# Patient Record
Sex: Female | Born: 1985 | Race: Black or African American | Hispanic: No | Marital: Single | Smoking: Never smoker
Health system: Southern US, Community
[De-identification: ages and names within clinical notes are randomized; demographics above are authoritative.]

## PROBLEM LIST (undated history)

## (undated) DIAGNOSIS — R002 Palpitations: Secondary | ICD-10-CM

## (undated) DIAGNOSIS — K219 Gastro-esophageal reflux disease without esophagitis: Secondary | ICD-10-CM

## (undated) DIAGNOSIS — I10 Essential (primary) hypertension: Secondary | ICD-10-CM

## (undated) DIAGNOSIS — F419 Anxiety disorder, unspecified: Secondary | ICD-10-CM

## (undated) DIAGNOSIS — D649 Anemia, unspecified: Secondary | ICD-10-CM

## (undated) DIAGNOSIS — G4730 Sleep apnea, unspecified: Secondary | ICD-10-CM

## (undated) DIAGNOSIS — Z87440 Personal history of urinary (tract) infections: Secondary | ICD-10-CM

## (undated) HISTORY — PX: UPPER GASTROINTESTINAL ENDOSCOPY: SHX188

## (undated) HISTORY — DX: Essential (primary) hypertension: I10

## (undated) HISTORY — DX: Palpitations: R00.2

## (undated) HISTORY — DX: Gastro-esophageal reflux disease without esophagitis: K21.9

## (undated) HISTORY — DX: Personal history of urinary (tract) infections: Z87.440

## (undated) HISTORY — DX: Anemia, unspecified: D64.9

---

## 2004-07-03 ENCOUNTER — Emergency Department (HOSPITAL_COMMUNITY): Admission: EM | Admit: 2004-07-03 | Discharge: 2004-07-03 | Payer: Self-pay | Admitting: Emergency Medicine

## 2009-11-10 LAB — US OB TRANSVAGINAL

## 2010-03-09 ENCOUNTER — Encounter: Admission: RE | Admit: 2010-03-09 | Discharge: 2010-04-28 | Payer: Self-pay | Admitting: Obstetrics and Gynecology

## 2010-05-14 ENCOUNTER — Inpatient Hospital Stay (HOSPITAL_COMMUNITY): Admission: AD | Admit: 2010-05-14 | Discharge: 2010-05-14 | Payer: Self-pay | Admitting: Obstetrics and Gynecology

## 2010-05-14 DIAGNOSIS — N760 Acute vaginitis: Secondary | ICD-10-CM

## 2010-05-14 DIAGNOSIS — O2390 Unspecified genitourinary tract infection in pregnancy, unspecified trimester: Secondary | ICD-10-CM

## 2010-05-14 DIAGNOSIS — O4700 False labor before 37 completed weeks of gestation, unspecified trimester: Secondary | ICD-10-CM

## 2010-05-14 DIAGNOSIS — A499 Bacterial infection, unspecified: Secondary | ICD-10-CM

## 2010-05-14 DIAGNOSIS — B9689 Other specified bacterial agents as the cause of diseases classified elsewhere: Secondary | ICD-10-CM

## 2010-07-02 ENCOUNTER — Inpatient Hospital Stay (HOSPITAL_COMMUNITY)
Admission: AD | Admit: 2010-07-02 | Discharge: 2010-07-06 | Payer: Self-pay | Source: Home / Self Care | Attending: Obstetrics and Gynecology | Admitting: Obstetrics and Gynecology

## 2010-07-03 ENCOUNTER — Encounter (INDEPENDENT_AMBULATORY_CARE_PROVIDER_SITE_OTHER): Payer: Self-pay | Admitting: Obstetrics and Gynecology

## 2010-07-06 ENCOUNTER — Emergency Department (HOSPITAL_COMMUNITY)
Admission: EM | Admit: 2010-07-06 | Discharge: 2010-07-06 | Payer: Self-pay | Source: Home / Self Care | Admitting: Psychiatry

## 2010-10-11 LAB — URINALYSIS, ROUTINE W REFLEX MICROSCOPIC
Bilirubin Urine: NEGATIVE
Ketones, ur: NEGATIVE mg/dL
Nitrite: NEGATIVE
Protein, ur: NEGATIVE mg/dL
Specific Gravity, Urine: 1.006 (ref 1.005–1.030)
Urobilinogen, UA: 0.2 mg/dL (ref 0.0–1.0)

## 2010-10-11 LAB — CBC
HCT: 34.8 % — ABNORMAL LOW (ref 36.0–46.0)
HCT: 26.1 % — ABNORMAL LOW (ref 36.0–46.0)
Hemoglobin: 11.4 g/dL — ABNORMAL LOW (ref 12.0–15.0)
Hemoglobin: 8.4 g/dL — ABNORMAL LOW (ref 12.0–15.0)
Hemoglobin: 8.6 g/dL — ABNORMAL LOW (ref 12.0–15.0)
MCH: 26.2 pg (ref 26.0–34.0)
MCH: 25 pg — ABNORMAL LOW (ref 26.0–34.0)
MCHC: 33 g/dL (ref 30.0–36.0)
MCHC: 33 g/dL (ref 30.0–36.0)
MCV: 75.9 fL — ABNORMAL LOW (ref 78.0–100.0)
Platelets: 134 10*3/uL — ABNORMAL LOW (ref 150–400)
Platelets: 203 10*3/uL (ref 150–400)
RBC: 3.44 MIL/uL — ABNORMAL LOW (ref 3.87–5.11)
RDW: 14.9 % (ref 11.5–15.5)
WBC: 9 10*3/uL (ref 4.0–10.5)
WBC: 8.5 10*3/uL (ref 4.0–10.5)

## 2010-10-11 LAB — D-DIMER, QUANTITATIVE: D-Dimer, Quant: 3.15 ug/mL-FEU — ABNORMAL HIGH (ref 0.00–0.48)

## 2010-10-11 LAB — DIFFERENTIAL
Basophils Absolute: 0 10*3/uL (ref 0.0–0.1)
Basophils Relative: 0 % (ref 0–1)
Eosinophils Absolute: 0.1 10*3/uL (ref 0.0–0.7)
Eosinophils Relative: 1 % (ref 0–5)
Lymphocytes Relative: 19 % (ref 12–46)

## 2010-10-11 LAB — COMPREHENSIVE METABOLIC PANEL
ALT: 12 U/L (ref 0–35)
Alkaline Phosphatase: 133 U/L — ABNORMAL HIGH (ref 39–117)
CO2: 26 mEq/L (ref 19–32)
Chloride: 103 mEq/L (ref 96–112)
GFR calc non Af Amer: >60 mL/min (ref >60)
Glucose, Bld: 74 mg/dL (ref 70–99)
Potassium: 4.4 mEq/L (ref 3.5–5.1)
Sodium: 136 mEq/L (ref 135–145)

## 2010-10-11 LAB — BASIC METABOLIC PANEL
BUN: 9 mg/dL (ref 6–23)
Chloride: 105 mEq/L (ref 96–112)
Creatinine, Ser: 0.87 mg/dL (ref 0.4–1.2)

## 2010-10-11 LAB — ABO/RH: ABO/RH(D): O POS

## 2010-10-11 LAB — RPR: RPR Ser Ql: NONREACTIVE

## 2010-10-12 LAB — URINALYSIS, ROUTINE W REFLEX MICROSCOPIC
Glucose, UA: NEGATIVE mg/dL
Hgb urine dipstick: NEGATIVE
Specific Gravity, Urine: 1.02 (ref 1.005–1.030)

## 2010-10-12 LAB — WET PREP, GENITAL
Trich, Wet Prep: NONE SEEN
Yeast Wet Prep HPF POC: NONE SEEN

## 2010-10-12 LAB — FETAL FIBRONECTIN: Fetal Fibronectin: NEGATIVE

## 2011-08-04 ENCOUNTER — Encounter: Payer: Self-pay | Admitting: Internal Medicine

## 2011-08-15 ENCOUNTER — Encounter: Payer: Self-pay | Admitting: Internal Medicine

## 2011-08-15 ENCOUNTER — Ambulatory Visit (INDEPENDENT_AMBULATORY_CARE_PROVIDER_SITE_OTHER): Payer: 59 | Admitting: Internal Medicine

## 2011-08-15 DIAGNOSIS — K3189 Other diseases of stomach and duodenum: Secondary | ICD-10-CM

## 2011-08-15 DIAGNOSIS — R1013 Epigastric pain: Secondary | ICD-10-CM

## 2011-08-15 DIAGNOSIS — R109 Unspecified abdominal pain: Secondary | ICD-10-CM

## 2011-08-15 NOTE — Progress Notes (Signed)
[name] 1986-04-01 MRN [id number]    History of Present Illness:  This is a 26 year old white female with dyspepsia, epigastric pain and food intolerance. She works in [employer] and switches shifts every 2 weeks. She works from 7 PM to 7 AM for 2 weeks and from 7 AM to 7 PM for 2 weeks. She had a normal pregnancy one year ago and delivered via C-section in December 2011. She saw a gastroenterologist for dyspepsia, food intolerance and abdominal pain last year and she was scheduled for an upper endoscopy but because of pregnancy, her workup was interrupted. She now has the same symptoms. An upper abdominal ultrasound was negative. There is no family history of gallbladder disease other than in her cousin. She does not smoke. Her weight has been stable although she is overweight. She does not take any nonsteroidal agents.   Past Medical History  Diagnosis Date  . Anemia   . Benign essential hypertension   . Hemorrhoids   . GERD (gastroesophageal reflux disease)   . Hx: UTI (urinary tract infection)    Past Surgical History  Procedure Date  . Cesarean section     reports that she has never smoked. She has never used smokeless tobacco. She reports that she does not drink alcohol or use illicit drugs. family history includes Diabetes in her mother.  There is no history of Colon cancer. No Known Allergies      Review of Systems: Denies dysphagia or 9 of dysphagia.  The remainder of the 10 point ROS is negative except as outlined in H&P   Physical Exam: General appearance  Well developed, in no distress. Eyes- non icteric. HEENT nontraumatic, normocephalic. Mouth no lesions, tongue papillated, no cheilosis. Neck supple without adenopathy, thyroid not enlarged, no carotid bruits, no JVD. Lungs Clear to auscultation bilaterally. Cor normal S1, normal S2, regular rhythm, no murmur,  quiet precordium. Abdomen: Minimal tenderness in epigastrium and right upper quadrant.  Normoactive bowel sounds.  Rectal: Hemoccult negative stool Extremities no pedal edema. Skin no lesions. Neurological alert and oriented x 3. Psychological normal mood and affect.  Assessment and Plan:  Problem #1 Chronic dyspepsia, possibly gastroesophageal reflux. We need to rule out irritable bowel syndrome. There is no evidence of lower GI symptomatology. Her ultrasound of the gallbladder was normal. We will start her empirically on Prilosec 20 mg daily and schedule her for an upper endoscopy. We will be checking for H. pylori. We have discussed her irregular schedule which I think has a major impac on herdigestiveI function.   08/15/2011 Lina Sar

## 2011-08-15 NOTE — Patient Instructions (Signed)
You have been scheduled for an endoscopy. Please follow written instructions given to you at your visit today. CC: Dr Nicholos Johns

## 2011-08-16 ENCOUNTER — Encounter: Payer: Self-pay | Admitting: Internal Medicine

## 2011-09-06 ENCOUNTER — Ambulatory Visit: Payer: 59 | Admitting: Internal Medicine

## 2011-09-15 ENCOUNTER — Encounter: Payer: Self-pay | Admitting: Internal Medicine

## 2011-09-15 ENCOUNTER — Ambulatory Visit (AMBULATORY_SURGERY_CENTER): Payer: 59 | Admitting: Internal Medicine

## 2011-09-15 VITALS — BP 134/81 | HR 88 | Temp 98.5°F | Resp 20 | Ht 64.0 in | Wt 186.0 lb

## 2011-09-15 DIAGNOSIS — K2990 Gastroduodenitis, unspecified, without bleeding: Secondary | ICD-10-CM

## 2011-09-15 DIAGNOSIS — R1013 Epigastric pain: Secondary | ICD-10-CM

## 2011-09-15 DIAGNOSIS — R109 Unspecified abdominal pain: Secondary | ICD-10-CM

## 2011-09-15 DIAGNOSIS — K2970 Gastritis, unspecified, without bleeding: Secondary | ICD-10-CM

## 2011-09-15 DIAGNOSIS — K3189 Other diseases of stomach and duodenum: Secondary | ICD-10-CM

## 2011-09-15 MED ORDER — OMEPRAZOLE 20 MG PO CPDR: 20.0000 mg | DELAYED_RELEASE_CAPSULE | Freq: Every day | ORAL | Status: DC

## 2011-09-15 MED ORDER — SODIUM CHLORIDE 0.9 % IV SOLN: 500.0000 mL | INTRAVENOUS | Status: DC

## 2011-09-15 MED ORDER — HYOSCYAMINE SULFATE 0.125 MG SL SUBL: 0.1250 mg | SUBLINGUAL_TABLET | SUBLINGUAL | Status: AC | PRN

## 2011-09-15 NOTE — Op Note (Signed)
[facility] [address] [place], [place]  [address]  ENDOSCOPY PROCEDURE REPORT  PATIENT:  [name], [name]  MR#:  [id number] BIRTHDATE:  03-14-1986, 25 yrs. old  GENDER:  female  ENDOSCOPIST:  Hedwig Morton. Juanda Chance, MD Referred by:  Georgianne Fick, M.D.  PROCEDURE DATE:  09/15/2011 PROCEDURE:  EGD with biopsy, 43239 ASA CLASS:  Class I INDICATIONS:  abdominal pain, dyspepsia  MEDICATIONS:   MAC sedation, administered by CRNA, propofol (Diprivan) 300 mg TOPICAL ANESTHETIC:  none  DESCRIPTION OF PROCEDURE:   After the risks benefits and alternatives of the procedure were thoroughly explained, informed consent was obtained.  The LB GIF-H180 [id number] endoscope was introduced through the mouth and advanced to the second portion of the duodenum, without limitations.  The instrument was slowly withdrawn as the mucosa was fully examined. <<PROCEDUREIMAGES>>  The upper, middle, and distal third of the esophagus were carefully inspected and no abnormalities were noted. The z-line was well seen at the GEJ. The endoscope was pushed into the fundus which was normal including a retroflexed view. The antrum,gastric body, first and second part of the duodenum were unremarkable. With standard forceps, a biopsy was obtained and sent to pathology. gastric Bx to r/o H (see image1, image2, image3, image4, image5, and image6).Pylori    Retroflexed views revealed no abnormalities. The scope was then withdrawn from the patient and the procedure completed.  COMPLICATIONS:  None  ENDOSCOPIC IMPRESSION: 1) Normal EGD s/p gactric biopsy to r/o H.Pylori RECOMMENDATIONS: 1) Await biopsy results Prilosec 20 mg po qd Levsin SL.125 mg  REPEAT EXAM:  In 0 year(s) for.  ______________________________ Hedwig Morton. Juanda Chance, MD  CC:  n. eSIGNED:   Hedwig Morton. Lawanna Cecere at 09/15/2011 09:01 AM  Blanchard Kelch, [phone]

## 2011-09-15 NOTE — Progress Notes (Signed)
Patient did not experience any of the following events: a burn prior to discharge; a fall within the facility; wrong site/side/patient/procedure/implant event; or a hospital transfer or hospital admission upon discharge from the facility. (G8907) Patient did not have preoperative order for IV antibiotic SSI prophylaxis. (G8918)  

## 2011-09-15 NOTE — Patient Instructions (Addendum)
Discharge instructions per blue and neon green sheets  We will mail you a letter in 1-2 weeks with the pathology results and Dr Regino Schultze recommendations  Start prilosec 20 mg once a day 20-30 minutes prior to first meal of the day and levsin 0.125 mg under the tongue as directed

## 2011-09-18 ENCOUNTER — Telehealth: Payer: Self-pay | Admitting: *Deleted

## 2011-09-18 NOTE — Telephone Encounter (Signed)
No answer. Message left to call as needed. 

## 2011-09-19 ENCOUNTER — Encounter: Payer: Self-pay | Admitting: Internal Medicine

## 2011-10-19 ENCOUNTER — Telehealth: Payer: Self-pay | Admitting: Internal Medicine

## 2011-10-19 MED ORDER — ONDANSETRON HCL 4 MG PO TABS: ORAL_TABLET | ORAL | Status: DC

## 2011-10-19 NOTE — Telephone Encounter (Signed)
Patient report that on Tuesday, she had nausea, vomiting and diarrhea. She felt she had a bug and took Pepto bismol and Imodium. The vomiting and diarrhea stopped. She continues to have nausea and her stomach is "bubbling." She is eating bland foods and is taking Prilosec daily. She will increase the Prilosec to BID x week. She is asking for something for nausea that will not make her sleepy since she hopes to return to work tomorrow. EGD- 09/15/11- mild gastritis. Please, advise.

## 2011-10-19 NOTE — Telephone Encounter (Signed)
Zofran 4 mg, #20, 1 po q 6-8 hrs prn nausea, 1 refill

## 2011-10-19 NOTE — Telephone Encounter (Signed)
Rx sent to pharmacy. Patient notified. 

## 2011-12-27 ENCOUNTER — Telehealth: Payer: Self-pay | Admitting: Internal Medicine

## 2011-12-27 NOTE — Telephone Encounter (Signed)
Patient to calling to report she is having diarrhea when she eats greasy foods. Today, she has not eaten or had much to drink and she has had diarrhea. Patient reports her daughter had this also for a couple of days (a bug). Patient instructed to avoid milk products, drink plenty of fluids, take Imodium prn for viral episode. She will call her PCP if this persists more the 24-48 hours.

## 2012-12-08 ENCOUNTER — Encounter (HOSPITAL_COMMUNITY): Payer: Self-pay

## 2012-12-08 ENCOUNTER — Emergency Department (HOSPITAL_COMMUNITY)
Admission: EM | Admit: 2012-12-08 | Discharge: 2012-12-08 | Disposition: A | Discharge status: Home / Self Care | Payer: Medicaid Other | Attending: Emergency Medicine | Admitting: Emergency Medicine

## 2012-12-08 DIAGNOSIS — Z8679 Personal history of other diseases of the circulatory system: Secondary | ICD-10-CM | POA: Insufficient documentation

## 2012-12-08 DIAGNOSIS — R1031 Right lower quadrant pain: Secondary | ICD-10-CM

## 2012-12-08 DIAGNOSIS — Z79899 Other long term (current) drug therapy: Secondary | ICD-10-CM | POA: Insufficient documentation

## 2012-12-08 DIAGNOSIS — Z862 Personal history of diseases of the blood and blood-forming organs and certain disorders involving the immune mechanism: Secondary | ICD-10-CM | POA: Insufficient documentation

## 2012-12-08 DIAGNOSIS — I10 Essential (primary) hypertension: Secondary | ICD-10-CM | POA: Insufficient documentation

## 2012-12-08 DIAGNOSIS — Z3202 Encounter for pregnancy test, result negative: Secondary | ICD-10-CM | POA: Insufficient documentation

## 2012-12-08 DIAGNOSIS — Z8719 Personal history of other diseases of the digestive system: Secondary | ICD-10-CM | POA: Insufficient documentation

## 2012-12-08 DIAGNOSIS — Z87440 Personal history of urinary (tract) infections: Secondary | ICD-10-CM | POA: Insufficient documentation

## 2012-12-08 LAB — URINALYSIS, ROUTINE W REFLEX MICROSCOPIC
Bilirubin Urine: NEGATIVE
Glucose, UA: NEGATIVE mg/dL
Hgb urine dipstick: NEGATIVE
Nitrite: NEGATIVE
Specific Gravity, Urine: 1.029 (ref 1.005–1.030)
pH: 6.5 (ref 5.0–8.0)

## 2012-12-08 LAB — COMPREHENSIVE METABOLIC PANEL
ALT: 11 U/L (ref 0–35)
AST: 20 U/L (ref 0–37)
Albumin: 4.1 g/dL (ref 3.5–5.2)
Calcium: 9.6 mg/dL (ref 8.4–10.5)
GFR calc Af Amer: >90 mL/min (ref >90)
Sodium: 141 mEq/L (ref 135–145)
Total Protein: 7.9 g/dL (ref 6.0–8.3)

## 2012-12-08 LAB — CBC WITH DIFFERENTIAL/PLATELET
Basophils Relative: 0 % (ref 0–1)
Eosinophils Relative: 1 % (ref 0–5)
HCT: 35.9 % — ABNORMAL LOW (ref 36.0–46.0)
Hemoglobin: 11.4 g/dL — ABNORMAL LOW (ref 12.0–15.0)
Lymphocytes Relative: 30 % (ref 12–46)
MCHC: 31.8 g/dL (ref 30.0–36.0)
Monocytes Relative: 7 % (ref 3–12)
Neutro Abs: 4.7 10*3/uL (ref 1.7–7.7)
Neutrophils Relative %: 62 % (ref 43–77)
RBC: 4.94 MIL/uL (ref 3.87–5.11)
WBC: 7.6 10*3/uL (ref 4.0–10.5)

## 2012-12-08 LAB — WET PREP, GENITAL
Trich, Wet Prep: NONE SEEN
Yeast Wet Prep HPF POC: NONE SEEN

## 2012-12-08 LAB — URINE MICROSCOPIC-ADD ON

## 2012-12-08 MED ORDER — SODIUM CHLORIDE 0.9 % IV BOLUS (SEPSIS)
1000.0000 mL | Freq: Once | INTRAVENOUS | Status: AC
  Administered 2012-12-08: 1000 mL via INTRAVENOUS

## 2012-12-08 MED ORDER — TRAMADOL HCL 50 MG PO TABS: 50.0000 mg | ORAL_TABLET | Freq: Four times a day (QID) | ORAL | Status: DC | PRN

## 2012-12-08 NOTE — ED Provider Notes (Signed)
  Medical screening examination/treatment/procedure(s) were performed by non-physician practitioner and as supervising physician I was immediately available for consultation/collaboration.    Tasean Mancha, MD 12/08/12 2315 

## 2012-12-08 NOTE — ED Notes (Signed)
Patient c/o RLQ pain x 1 week and is worse today. Patient denies N/V/D. Patient went to [facility] and a CT Scan was recommended.

## 2012-12-08 NOTE — ED Provider Notes (Signed)
History     CSN: [id number]  Arrival date & time 12/08/12  1707   First MD Initiated Contact with Patient 12/08/12 1728      Chief Complaint  Patient presents with  . Abdominal Pain    (Consider location/radiation/quality/duration/timing/severity/associated sxs/prior treatment) HPI  [name] is a 27 y.o. female c/o worsening RLQ abdominal pain for the last 6 days.  Patient states pain started last Monday and has been worsening especially in the past 48 hours.  Patient reports pain is localized to lower right quadrant and occasionally radiates down right leg.  Patient states lying flat does lessen the pain.  Patient denies any aggravating factors.  Patient denies associated symptoms.  Patient states pain is 3/10 on average, 7/10 now and and.  Patient denies abdominal surgery other than C-section.  Patient states she is sexually active and uses condoms as birth control.  Patient's last menstrual cycle ended on 04/25.  Pertinent negatives include no fever or headache, no cough or shortness of breath, no chest pain, no n/v/d, no melena or hematochezia, no dysuria or hematuria, no abnormal vaginal discharge. Patient sent to the ED from urgent care clinic for further evaluation.  Past Medical History  Diagnosis Date  . Anemia   . Benign essential hypertension   . Hemorrhoids   . GERD (gastroesophageal reflux disease)   . Hx: UTI (urinary tract infection)     Past Surgical History  Procedure Laterality Date  . Cesarean section      Family History  Problem Relation Age of Onset  . Colon cancer Neg Hx   . Diabetes Mother     Multiple members on mothers side     History  Substance Use Topics  . Smoking status: Never Smoker   . Smokeless tobacco: Never Used  . Alcohol Use: No    OB History   Grav Para Term Preterm Abortions TAB SAB Ect Mult Living                  Review of Systems  Constitutional: Negative for fever.  Respiratory: Negative for shortness of breath.    Cardiovascular: Negative for chest pain.  Gastrointestinal: Positive for abdominal pain. Negative for nausea, vomiting and diarrhea.  All other systems reviewed and are negative.    Allergies  Review of patient's allergies indicates no known allergies.  Home Medications   Current Outpatient Rx  Name  Route  Sig  Dispense  Refill  . calcium carbonate (TUMS) 500 MG chewable tablet   Oral   Chew 1 tablet by mouth as needed for heartburn.          . fish oil-omega-3 fatty acids 1000 MG capsule   Oral   Take 1 g by mouth 2 (two) times daily.         . vitamin C (ASCORBIC ACID) 500 MG tablet   Oral   Take 500 mg by mouth daily.         Marland Kitchen amLODipine (NORVASC) 5 MG tablet   Oral   Take 5 mg by mouth daily.           BP 140/76  Pulse 94  Temp(Src) 99 F (37.2 C) (Oral)  Resp 16  Ht 5\' 4"  (1.626 m)  Wt 188 lb 2 oz (85.333 kg)  BMI 32.28 kg/m2  SpO2 99%  LMP 11/22/2012  Physical Exam  Nursing note and vitals reviewed. Constitutional: She is oriented to person, place, and time. She appears well-developed and well-nourished.  No distress.  HENT:  Head: Normocephalic and atraumatic.  Mouth/Throat: Oropharynx is clear and moist.  Eyes: Conjunctivae and EOM are normal. Pupils are equal, round, and reactive to light.  Neck: Normal range of motion.  Cardiovascular: Normal rate, regular rhythm and intact distal pulses.   Pulmonary/Chest: Effort normal and breath sounds normal. No stridor. No respiratory distress. She has no wheezes. She has no rales. She exhibits no tenderness.  Abdominal: Soft. Bowel sounds are normal. She exhibits no distension and no mass. There is no tenderness. There is no rebound and no guarding.  No tenderness to palpation in any quadrant, Murphy sign is negative, no tenderness over McBurney's point, obturator is negative, psoas is negative, Rosing is negative.  Genitourinary:  Pelvic exam chaperoned by technician:  No lesions or rashes,  No  foul-smelling discharge, thin white discharge is present, no cervical motion or adnexal tenderness.  Musculoskeletal: Normal range of motion.  Neurological: She is alert and oriented to person, place, and time.  Psychiatric: She has a normal mood and affect.    ED Course  Procedures (including critical care time)  Labs Reviewed  WET PREP, GENITAL - Abnormal; Notable for the following:    WBC, Wet Prep HPF POC FEW (*)    All other components within normal limits  CBC WITH DIFFERENTIAL - Abnormal; Notable for the following:    Hemoglobin 11.4 (*)    HCT 35.9 (*)    MCV 72.7 (*)    MCH 23.1 (*)    All other components within normal limits  COMPREHENSIVE METABOLIC PANEL - Abnormal; Notable for the following:    GFR calc non Af Amer 89 (*)    All other components within normal limits  URINALYSIS, ROUTINE W REFLEX MICROSCOPIC - Abnormal; Notable for the following:    APPearance CLOUDY (*)    Leukocytes, UA SMALL (*)    All other components within normal limits  GC/CHLAMYDIA PROBE AMP  LIPASE, BLOOD  URINE MICROSCOPIC-ADD ON  POCT PREGNANCY, URINE   No results found.   1. Pain, abdominal, RLQ       MDM   Filed Vitals:   12/08/12 1710 12/08/12 1718  BP: 140/76   Pulse: 94   Temp: 99 F (37.2 C)   TempSrc: Oral   Resp: 16   Height:  5\' 4"  (1.626 m)  Weight:  188 lb 2 oz (85.333 kg)  SpO2: 99%      [name] is a 27 y.o. female with mild right lower quadrant pain rated at 3/10 not associated with nausea vomiting diarrhea, fever. Patient has no leukocytosis. Abdominal exam is completely benign. I have very little index of suspicion for appendicitis, visually considering the length of time of the symptoms going on for 7 days. Patient is extremely comfortable refusing pain medication. Pelvic exam is normal and there are no abnormalities on her urinalysis, blood work or cervical samples.  I have discussed the recommendation of proceed with CT as recommended by urgent  care clinic with the patient. I have advised her that based on the clinical summary on blood work I think it will expose her to radiation unnecessarily. I have also said that she would like to be happy to order for her. Patient has chosen to defer imaging at this time.  VSS and patient is appropriate for, and amenable to, discharge at this time. Pt verbalized understanding and agrees with care plan. Outpatient follow-up and return precautions given.    Medications  sodium chloride  0.9 % bolus 1,000 mL (0 mLs Intravenous Stopped 12/08/12 1905)    New Prescriptions   TRAMADOL (ULTRAM) 50 MG TABLET    Take 1 tablet (50 mg total) by mouth every 6 (six) hours as needed for pain.           Wynetta Emery, PA-C 12/08/12 1907  Wynetta Emery, PA-C 12/08/12 1908

## 2012-12-09 LAB — GC/CHLAMYDIA PROBE AMP: CT Probe RNA: NEGATIVE

## 2013-06-05 ENCOUNTER — Other Ambulatory Visit: Payer: Self-pay

## 2014-05-08 ENCOUNTER — Ambulatory Visit (HOSPITAL_BASED_OUTPATIENT_CLINIC_OR_DEPARTMENT_OTHER): Admission: RE | Admit: 2014-05-08 | Payer: 59 | Source: Ambulatory Visit | Admitting: Otolaryngology

## 2014-05-08 ENCOUNTER — Encounter (HOSPITAL_BASED_OUTPATIENT_CLINIC_OR_DEPARTMENT_OTHER): Admission: RE | Payer: Self-pay | Source: Ambulatory Visit

## 2014-05-08 SURGERY — TONSILLECTOMY AND ADENOIDECTOMY
Anesthesia: General | Laterality: Bilateral

## 2015-01-12 ENCOUNTER — Other Ambulatory Visit: Payer: Self-pay | Admitting: Obstetrics and Gynecology

## 2015-01-14 LAB — CYTOLOGY - PAP

## 2017-06-15 ENCOUNTER — Telehealth: Payer: Self-pay | Admitting: Neurology

## 2017-06-15 ENCOUNTER — Ambulatory Visit: Payer: 59 | Admitting: Neurology

## 2017-06-15 NOTE — Telephone Encounter (Signed)
This patient did not show for a new patient appointment today. 

## 2017-06-18 ENCOUNTER — Encounter: Payer: Self-pay | Admitting: Neurology

## 2018-06-12 ENCOUNTER — Encounter: Payer: Self-pay | Admitting: Gastroenterology

## 2018-06-12 ENCOUNTER — Ambulatory Visit: Payer: 59 | Admitting: Gastroenterology

## 2018-06-12 VITALS — BP 130/98 | HR 88 | Ht 60.5 in | Wt 205.4 lb

## 2018-06-12 DIAGNOSIS — R1031 Right lower quadrant pain: Secondary | ICD-10-CM | POA: Diagnosis not present

## 2018-06-12 NOTE — Patient Instructions (Addendum)
I am recommending a CTof abd/pelvis with contrast now.  Follow-up with GYN as you have already planned.  If your CT is negative, please talk to Dr. Ashby Dawes about the possibility of radiculopathy.   You have been scheduled for a CT scan of the abdomen and pelvis at [facility] ([address]---this is in the same building as Press [facility]).   You are scheduled on 06-24-18 at 1:30 pm. You should arrive 15 minutes prior to your appointment time for registration. Please follow the written instructions below on the day of your exam:  WARNING: IF YOU ARE ALLERGIC TO IODINE/X-RAY DYE, PLEASE NOTIFY RADIOLOGY IMMEDIATELY AT [phone]! YOU WILL BE GIVEN A 13 HOUR PREMEDICATION PREP.  1) Do not eat anything after 9:30 am (4 hours prior to your test) 2) You have been given 2 bottles of oral contrast to drink. The solution may taste better if refrigerated, but do NOT add ice or any other liquid to this solution. Shake well before drinking.    Drink 1 bottle of contrast @ 11:30 am (2 hours prior to your exam)  Drink 1 bottle of contrast @ 12:30 pm (1 hour prior to your exam)  You may take any medications as prescribed with a small amount of water, if necessary. If you take any of the following medications: METFORMIN, GLUCOPHAGE, GLUCOVANCE, AVANDAMET, RIOMET, FORTAMET, Leland MET, JANUMET, GLUMETZA or METAGLIP, you MAY be asked to HOLD this medication 48 hours AFTER the exam.  The purpose of you drinking the oral contrast is to aid in the visualization of your intestinal tract. The contrast solution may cause some diarrhea. Depending on your individual set of symptoms, you may also receive an intravenous injection of x-ray contrast/dye. Plan on being at [facility] for 30 minutes or longer, depending on the type of exam you are having performed.  This test typically takes 30-45 minutes to complete.  If you have any questions regarding your exam or if you need to  reschedule, you may call the CT department at [phone] between the hours of 8:00 am and 5:00 pm, Monday-Friday.  ________________________________________________________________________

## 2018-06-12 NOTE — Progress Notes (Signed)
Referring Provider: Merrilee Seashore, MD Primary Care Physician:  Merrilee Seashore, MD   Reason for Consultation:  Stomach pain   IMPRESSION:  Right lower quadrant abdominal/groin pain  History of H pylori negative gastritis 2013  Pain is atypical in location and nature for GI origin. May be lumbar radiculopathy. Features are atypical for abdominal cutaneous nerve entrapment syndrome.   PLAN: CT abd/pelvis with contrast Follow-up with GYN as already planned I have asked her to review the possible diagnosis of radiculopathy with Dr. Ashby Dawes if her CT is negative Return to GI clinic as needed   HPI: [name] is a 32 y.o. female previously seen by Dr. Olevia Perches in 2013.  She presents now with complaints of right lower abdominal/groin pain.  The history is obtained through the patient and review of her electronic health record.  She reports a right groin pain described as a dull ache that radiates down the front of her right thigh, around to the back over the last 2 years. She will often feel a tightening feeling 3-4 inches above the location of the right groin pain. Exacerbated by weight gain and sitting upright. Relieved by a supine position. No change in pain with standing. No hypaesthesia, hyperalgesia, or allodynia around the area of pain. No associated change in bowel habits. Baseline bowel habits are two bowel movements weekly.  No other associated symptoms.  No other identified exacerbating features.  Some noted lactose intolerance.  Prescribed an NSAID and a muscle relaxant for pain control but she didn't find it helpful. She doesn't want to take medications unless she knows what they are for.  No other identified relieving features.  She has an appointment with GYN to evaluate the same symptoms. She will see Urology next month, as well.   She had an upper endoscopy with Dr. Olevia Perches 09/15/2011 for abdominal pain and dyspepsia that was normal.  Biopsies showed chronic  inactive gastritis.  She was H. pylori negative.  She was treated with proton pump inhibitor therapy and Levsin SL 125 mg. An abdominal ultrasound was normal.  Symptoms she reports today are completely different from those in 2013.   Past Medical History:  Diagnosis Date  . Anemia   . Benign essential hypertension   . GERD (gastroesophageal reflux disease)   . Hemorrhoids   . Hx: UTI (urinary tract infection)     Past Surgical History:  Procedure Laterality Date  . CESAREAN SECTION      Current Outpatient Medications  Medication Sig Dispense Refill  . amLODipine (NORVASC) 5 MG tablet Take 5 mg by mouth daily.    . calcium carbonate (TUMS) 500 MG chewable tablet Chew 1 tablet by mouth as needed for heartburn.     . fish oil-omega-3 fatty acids 1000 MG capsule Take 1 g by mouth 2 (two) times daily.    . traMADol (ULTRAM) 50 MG tablet Take 1 tablet (50 mg total) by mouth every 6 (six) hours as needed for pain. 15 tablet 0  . vitamin C (ASCORBIC ACID) 500 MG tablet Take 500 mg by mouth daily.     No current facility-administered medications for this visit.     Allergies as of 06/12/2018  . (No Known Allergies)    Family History  Problem Relation Age of Onset  . Colon cancer Neg Hx   . Diabetes Mother        Multiple members on mothers side     Social History   Socioeconomic History  . Marital  status: Single    Spouse name: Not on file  . Number of children: 1  . Years of education: Not on file  . Highest education level: Not on file  Occupational History  . Occupation: DETENTION OFFICER    Employer: [employer]  Social Needs  . Financial resource strain: Not on file  . Food insecurity:    Worry: Not on file    Inability: Not on file  . Transportation needs:    Medical: Not on file    Non-medical: Not on file  Tobacco Use  . Smoking status: Never Smoker  . Smokeless tobacco: Never Used  Substance and Sexual Activity  . Alcohol use: No  . Drug use: No  .  Sexual activity: Yes    Birth control/protection: Patch  Lifestyle  . Physical activity:    Days per week: Not on file    Minutes per session: Not on file  . Stress: Not on file  Relationships  . Social connections:    Talks on phone: Not on file    Gets together: Not on file    Attends religious service: Not on file    Active member of club or organization: Not on file    Attends meetings of clubs or organizations: Not on file    Relationship status: Not on file  . Intimate partner violence:    Fear of current or ex partner: Not on file    Emotionally abused: Not on file    Physically abused: Not on file    Forced sexual activity: Not on file  Other Topics Concern  . Not on file  Social History Narrative   Daily caffeine     Review of Systems: 12 system ROS is negative except as noted above.   Physical Exam: Vital signs were reviewed. General:   Alert, well-nourished, pleasant and cooperative in NAD Head:  Normocephalic and atraumatic. Eyes:  Sclera clear, no icterus.   Conjunctiva pink. Mouth:  No deformity or lesions.   Neck:  Supple; no thyromegaly. Lungs:  Clear throughout to auscultation.   No wheezes.  Heart:  Regular rate and rhythm; no murmurs Abdomen:  Soft, tender in the right groin without rebound or guarding, normal bowel sounds. No hepatosplenomegaly. Pelvic bone is not tender. Carnett's sign negative. Rectal:  Deferred  Msk:  Symmetrical without gross deformities. Extremities:  No gross deformities or edema. Neurologic:  Alert and  oriented x4;  grossly nonfocal Skin:  No rash or bruise. Psych:  Alert and cooperative. Normal mood and affect.   Duquan Gillooly L. Tarri Glenn Md, MPH [facility] 06/12/2018, 8:13 AM

## 2018-06-24 ENCOUNTER — Inpatient Hospital Stay: Admission: RE | Admit: 2018-06-24 | Payer: 59 | Source: Ambulatory Visit

## 2018-07-17 ENCOUNTER — Inpatient Hospital Stay: Admission: RE | Admit: 2018-07-17 | Payer: 59 | Source: Ambulatory Visit

## 2018-08-19 ENCOUNTER — Emergency Department (HOSPITAL_COMMUNITY)
Admission: EM | Admit: 2018-08-19 | Discharge: 2018-08-19 | Disposition: A | Discharge status: Home / Self Care | Payer: 59 | Attending: Emergency Medicine | Admitting: Emergency Medicine

## 2018-08-19 ENCOUNTER — Emergency Department (HOSPITAL_COMMUNITY): Payer: 59

## 2018-08-19 DIAGNOSIS — I10 Essential (primary) hypertension: Secondary | ICD-10-CM | POA: Diagnosis not present

## 2018-08-19 DIAGNOSIS — Z862 Personal history of diseases of the blood and blood-forming organs and certain disorders involving the immune mechanism: Secondary | ICD-10-CM | POA: Diagnosis not present

## 2018-08-19 DIAGNOSIS — R531 Weakness: Secondary | ICD-10-CM | POA: Diagnosis present

## 2018-08-19 DIAGNOSIS — R29898 Other symptoms and signs involving the musculoskeletal system: Secondary | ICD-10-CM | POA: Diagnosis not present

## 2018-08-19 DIAGNOSIS — Z79899 Other long term (current) drug therapy: Secondary | ICD-10-CM | POA: Insufficient documentation

## 2018-08-19 DIAGNOSIS — R0789 Other chest pain: Secondary | ICD-10-CM

## 2018-08-19 LAB — CBC WITH DIFFERENTIAL/PLATELET
ABS IMMATURE GRANULOCYTES: 0.03 10*3/uL (ref 0.00–0.07)
BASOS ABS: 0 10*3/uL (ref 0.0–0.1)
BASOS PCT: 0 %
EOS ABS: 0.1 10*3/uL (ref 0.0–0.5)
Eosinophils Relative: 2 %
HCT: 41.5 % (ref 36.0–46.0)
Hemoglobin: 12.5 g/dL (ref 12.0–15.0)
Immature Granulocytes: 0 %
Lymphocytes Relative: 30 %
Lymphs Abs: 2.4 10*3/uL (ref 0.7–4.0)
MCH: 23.2 pg — ABNORMAL LOW (ref 26.0–34.0)
MCHC: 30.1 g/dL (ref 30.0–36.0)
MCV: 77.1 fL — ABNORMAL LOW (ref 80.0–100.0)
MONOS PCT: 6 %
Monocytes Absolute: 0.5 10*3/uL (ref 0.1–1.0)
NEUTROS ABS: 4.7 10*3/uL (ref 1.7–7.7)
NEUTROS PCT: 62 %
NRBC: 0 % (ref 0.0–0.2)
PLATELETS: 265 10*3/uL (ref 150–400)
RBC: 5.38 MIL/uL — ABNORMAL HIGH (ref 3.87–5.11)
RDW: 15.3 % (ref 11.5–15.5)
WBC: 7.8 10*3/uL (ref 4.0–10.5)

## 2018-08-19 LAB — BASIC METABOLIC PANEL
ANION GAP: 13 (ref 5–15)
BUN: 10 mg/dL (ref 6–20)
CO2: 24 mmol/L (ref 22–32)
Calcium: 9.4 mg/dL (ref 8.9–10.3)
Chloride: 101 mmol/L (ref 98–111)
Creatinine, Ser: 0.84 mg/dL (ref 0.44–1.00)
GLUCOSE: 77 mg/dL (ref 70–99)
Potassium: 3 mmol/L — ABNORMAL LOW (ref 3.5–5.1)
Sodium: 138 mmol/L (ref 135–145)

## 2018-08-19 LAB — I-STAT TROPONIN, ED: TROPONIN I, POC: 0 ng/mL (ref 0.00–0.08)

## 2018-08-19 LAB — PROTIME-INR
INR: 0.91
Prothrombin Time: 12.2 seconds (ref 11.4–15.2)

## 2018-08-19 NOTE — ED Triage Notes (Addendum)
Pt complains of left arm weakness, chest pain and headache. Pt states her left arm started feeling weak after eating trail mix at 12PM today. Pt states she has been trying the keto diet and intermittent fasting for the past couple of weeks and has been using a saltier soy sauce. Pt also states she has occasionally been skipping her BP medication. No arm drift, slurred speech, or facial droop.

## 2018-08-19 NOTE — Discharge Instructions (Signed)
Follow-up with cardiology to discuss a stress test.  The contact information for the [facility] cardiology clinic has been provided in this discharge summary for you to call and make these arrangements.

## 2018-08-19 NOTE — ED Provider Notes (Signed)
[facility] DEPT Provider Note   CSN: [id number] Arrival date & time: 08/19/18  1434     History   Chief Complaint Chief Complaint  Patient presents with  . Extremity Weakness    left arm  . Chest Pain    HPI [name] is a 33 y.o. female.  Patient is a 33 year old female with history of anemia, GERD, and hypertension.  She presents today for evaluation of left arm weakness.  This started earlier today around noon, lasted for several hours, then resolved shortly after arriving here.  She describes decreased strength and does have her arm felt "heavy".  She also describes an episode of sharp pain to the left side of her chest that occurred yesterday.  This lasted for several minutes, then seemed to resolve.  She denies any difficulty breathing, nausea, or diaphoresis.  She denies any weakness of her face or legs.  The history is provided by the patient.  Extremity Weakness  This is a new problem. The problem occurs constantly. The problem has not changed since onset.Nothing aggravates the symptoms. Nothing relieves the symptoms. She has tried nothing for the symptoms.    Past Medical History:  Diagnosis Date  . Anemia   . Benign essential hypertension   . GERD (gastroesophageal reflux disease)   . Hemorrhoids   . Hx: UTI (urinary tract infection)     There are no active problems to display for this patient.   Past Surgical History:  Procedure Laterality Date  . CESAREAN SECTION       OB History   No obstetric history on file.      Home Medications    Prior to Admission medications   Medication Sig Start Date End Date Taking? Authorizing Provider  amLODipine (NORVASC) 5 MG tablet Take 5 mg by mouth daily.    [provider]  calcium carbonate (TUMS) 500 MG chewable tablet Chew 1 tablet by mouth as needed for heartburn.     [provider]  etonogestrel (NEXPLANON) 68 MG IMPL implant 1 each by Subdermal route  once.    [provider]  fish oil-omega-3 fatty acids 1000 MG capsule Take 1 g by mouth as needed.     [provider]  vitamin C (ASCORBIC ACID) 500 MG tablet Take 500 mg by mouth daily.    [provider]    Family History Family History  Problem Relation Age of Onset  . Diabetes Mother        Multiple members on mothers side   . Colon cancer Neg Hx     Social History Social History   Tobacco Use  . Smoking status: Never Smoker  . Smokeless tobacco: Never Used  Substance Use Topics  . Alcohol use: No  . Drug use: No     Allergies   Patient has no known allergies.   Review of Systems Review of Systems  Musculoskeletal: Positive for extremity weakness.  All other systems reviewed and are negative.    Physical Exam Updated Vital Signs BP (!) 148/94 (BP Location: Right Arm)   Pulse (!) 105   Temp 98.9 F (37.2 C) (Oral)   Resp 20   Ht 5' 1.5" (1.562 m)   Wt 93 kg   SpO2 100%   BMI 38.11 kg/m   Physical Exam Vitals signs and nursing note reviewed.  Constitutional:      General: She is not in acute distress.    Appearance: She is well-developed.  She is not diaphoretic.  HENT:     Head: Normocephalic and atraumatic.  Eyes:     Extraocular Movements: Extraocular movements intact.     Pupils: Pupils are equal, round, and reactive to light.  Neck:     Musculoskeletal: Normal range of motion and neck supple.  Cardiovascular:     Rate and Rhythm: Normal rate and regular rhythm.     Heart sounds: No murmur. No friction rub. No gallop.   Pulmonary:     Effort: Pulmonary effort is normal. No respiratory distress.     Breath sounds: Normal breath sounds. No wheezing.  Abdominal:     General: Bowel sounds are normal. There is no distension.     Palpations: Abdomen is soft.     Tenderness: There is no abdominal tenderness.  Musculoskeletal: Normal range of motion.  Skin:    General: Skin is warm and dry.  Neurological:      General: No focal deficit present.     Mental Status: She is alert and oriented to person, place, and time.     Cranial Nerves: No cranial nerve deficit, dysarthria or facial asymmetry.     Sensory: Sensation is intact.     Motor: Motor function is intact. No weakness or abnormal muscle tone.     Coordination: Coordination is intact.  Psychiatric:        Mood and Affect: Mood normal.        Behavior: Behavior normal.      ED Treatments / Results  Labs (all labs ordered are listed, but only abnormal results are displayed) Labs Reviewed  BASIC METABOLIC PANEL  CBC WITH DIFFERENTIAL/PLATELET  PROTIME-INR  I-STAT TROPONIN, ED    EKG EKG Interpretation  Date/Time:  Monday August 19 2018 15:01:49 EST Ventricular Rate:  103 PR Interval:    QRS Duration: 84 QT Interval:  381 QTC Calculation: 499 R Axis:   64 Text Interpretation:  Sinus tachycardia Abnormal T, consider ischemia, diffuse leads Prolonged QT interval Confirmed by Veryl Speak [id number] on 08/19/2018 4:47:51 PM   Radiology No results found.  Procedures Procedures (including critical care time)  Medications Ordered in ED Medications - No data to display   Initial Impression / Assessment and Plan / ED Course  I have reviewed the triage vital signs and the nursing notes.  Pertinent labs & imaging results that were available during my care of the patient were reviewed by me and considered in my medical decision making (see chart for details).  Patient presents here with complaints of weakness in her left arm that began earlier today.  She also describes an episode yesterday where she had a pain in her left chest that she described as "lightning".  This lasted for seconds, then resolved.  Her work-up today reveals nonspecific T wave changes on her EKG.  She is currently pain-free and her symptoms are atypical for cardiac pain, her troponin is negative.  I am uncertain as to the etiology of these EKG findings,  however do not feel that these are acute.  I do not feel as though these need emergent hospitalization, but would recommend the patient follow-up with cardiology as she does have risk factors and a family history.  She also complained of weakness in her left arm.  An MRI was obtained showing no evidence for stroke.  I am uncertain as to the etiology of this, but feel as though stroke has been ruled out.  Final Clinical Impressions(s) / ED Diagnoses  Final diagnoses:  None    ED Discharge Orders    None       Veryl Speak, MD 08/19/18 1911

## 2018-08-25 NOTE — Progress Notes (Signed)
Cardiology Office Note   Date:  08/30/2018   ID:  [name], DOB August 29, 1985, MRN [id number]  PCP:  Merrilee Seashore, MD  Cardiologist:   Naylee Frankowski [place], MD   Chief Complaint  Patient presents with  . Chest Pain  . Hypertension      History of Present Illness: [name] is a 33 y.o. female who is seen at the request of Dr. Ashby Dawes for evaluation of an abnormal Ecg. She has a history of HTN and GERD. Seen recently in the ED with left arm weakness and atypical sharp chest pain. Ecg showed sinus tachycardia with rate 103. T wave inversion in the inferolateral leads. No old Ecg to compare. She had normal MRI brain and normal troponin.  She reports a long standing history of HTN. She has been on medication for 4-5 years but doesn't take her medication regularly. When she went to the ED she hadn't taken her medication for a couple of weeks. She developed a HA and some weakness in her left arm. She had a light ache in her left chest that lasted several days. This felt more muscular. She is taking her amlodipine now and feels better. No history of DM, HLD, tobacco use. Strong family history of HTN and stroke.    Past Medical History:  Diagnosis Date  . Anemia   . Benign essential hypertension   . GERD (gastroesophageal reflux disease)   . Hemorrhoids   . Hx: UTI (urinary tract infection)     Past Surgical History:  Procedure Laterality Date  . CESAREAN SECTION       Current Outpatient Medications  Medication Sig Dispense Refill  . amLODipine (NORVASC) 5 MG tablet Take 5 mg by mouth daily.    Marland Kitchen etonogestrel (NEXPLANON) 68 MG IMPL implant 1 each by Subdermal route once.    Marland Kitchen ibuprofen (ADVIL,MOTRIN) 200 MG tablet Take 400 mg by mouth daily as needed for mild pain.    . vitamin C (ASCORBIC ACID) 500 MG tablet Take 500 mg by mouth daily.     No current facility-administered medications for this visit.     Allergies:   Patient has no known allergies.     Social History:  The patient  reports that she has never smoked. She has never used smokeless tobacco. She reports that she does not drink alcohol or use drugs.   Family History:  The patient's family history includes Diabetes in her mother; Hyperlipidemia in her father; Hypertension in her brother, father, and mother; Other in her child; Sarcoidosis in her mother; Stroke in her mother.    ROS:  Please see the history of present illness.   Otherwise, review of systems are positive for none.   All other systems are reviewed and negative.    PHYSICAL EXAM: VS:  BP (!) 130/96 (BP Location: Left Arm)   Ht 5\' 5"  (1.651 m)   Wt 202 lb 9.6 oz (91.9 kg)   BMI 33.71 kg/m  , BMI Body mass index is 33.71 kg/m. GEN: Well nourished, obese, in no acute distress  HEENT: normal  Neck: no JVD, carotid bruits, or masses Cardiac: RRR; no murmurs, rubs, or gallops,no edema  Respiratory:  clear to auscultation bilaterally, normal work of breathing GI: soft, nontender, nondistended, + BS MS: no deformity or atrophy  Skin: warm and dry, no rash Neuro:  Strength and sensation are intact Psych: euthymic mood, full affect    Recent Labs: 08/19/2018: BUN 10; Creatinine, Ser 0.84; Hemoglobin  12.5; Platelets 265; Potassium 3.0; Sodium 138    Lipid Panel No results found for: CHOL, TRIG, HDL, CHOLHDL, VLDL, LDLCALC, LDLDIRECT    Labs 09/17/17: cholesterol 169, triglycerides 41, HDL 57, LDL 104. A1c 6%. TSH and ALT normal.  Wt Readings from Last 3 Encounters:  08/30/18 202 lb 9.6 oz (91.9 kg)  08/19/18 205 lb (93 kg)  06/12/18 205 lb 6 oz (93.2 kg)      Other studies Reviewed: Additional studies/ records that were reviewed today include:   Ecg today shows NSR with nonspecific TWA with flattening. I have personally reviewed and interpreted this study. Compared to Ecg done 08/19/18 HR is slower and T wave abnormality is less.     ASSESSMENT AND PLAN:  1.  Abnormal Ecg. She has a nonspecific T  wave abnormality. This is most likely due to HTN and changes were more noticeable when BP was not controlled. Stressed the importance of taking her BP medication as prescribed. Discussed lifestyle modifications including diet and exercise to help her BP. She is going to get a home BP monitor and watch her BP. Follow up with primary care 2. Atypical chest pain. Doubt cardiac related. Low risk from CV standpoint except for BP.    Current medicines are reviewed at length with the patient today.  The patient does not have concerns regarding medicines.  The following changes have been made:  no change  Labs/ tests ordered today include: none  No orders of the defined types were placed in this encounter.    Disposition:   FU with PRN   Signed, Alease Fait Martin[place], MD  08/30/2018 4:44 PM    [facility] [address] [place], [place], [address] Phone [phone], Fax [phone]

## 2018-08-30 ENCOUNTER — Ambulatory Visit: Payer: 59 | Admitting: Cardiology

## 2018-08-30 ENCOUNTER — Encounter: Payer: Self-pay | Admitting: Cardiology

## 2018-08-30 VITALS — BP 130/96 | Ht 65.0 in | Wt 202.6 lb

## 2018-08-30 DIAGNOSIS — I10 Essential (primary) hypertension: Secondary | ICD-10-CM

## 2018-08-30 DIAGNOSIS — R9431 Abnormal electrocardiogram [ECG] [EKG]: Secondary | ICD-10-CM

## 2018-08-30 NOTE — Addendum Note (Signed)
Addended by: Kathyrn Lass on: 08/30/2018 04:49 PM   Modules accepted: Orders

## 2019-03-20 ENCOUNTER — Other Ambulatory Visit: Payer: 59

## 2019-03-28 ENCOUNTER — Ambulatory Visit (INDEPENDENT_AMBULATORY_CARE_PROVIDER_SITE_OTHER)
Admission: RE | Admit: 2019-03-28 | Discharge: 2019-03-28 | Disposition: A | Discharge status: Home / Self Care | Payer: 59 | Source: Ambulatory Visit | Attending: Gastroenterology | Admitting: Gastroenterology

## 2019-03-28 ENCOUNTER — Other Ambulatory Visit: Payer: Self-pay

## 2019-03-28 DIAGNOSIS — R1031 Right lower quadrant pain: Secondary | ICD-10-CM

## 2019-03-28 MED ORDER — IOHEXOL 300 MG/ML  SOLN
100.0000 mL | Freq: Once | INTRAMUSCULAR | Status: AC | PRN
  Administered 2019-03-28: 100 mL via INTRAVENOUS

## 2019-03-31 ENCOUNTER — Telehealth: Payer: Self-pay | Admitting: Gastroenterology

## 2019-03-31 NOTE — Telephone Encounter (Signed)
Refer to result note 

## 2019-09-03 IMAGING — CT CT ABDOMEN AND PELVIS WITH CONTRAST
2 of 4 series · 16 of 46 positions shown, 18 images · IV contrast (OMNIPAQUE 300)
Comparison: CT of the abdomen pelvis dated 09/25/2018

CLINICAL DATA: 33-year-old female with right lower quadrant
abdominal pain radiating to the lower back.

EXAM:
CT ABDOMEN AND PELVIS WITH CONTRAST
TECHNIQUE: Multidetector CT imaging of the abdomen and pelvis was performed
using the standard protocol following bolus administration of
intravenous contrast.
CONTRAST:  100mL OMNIPAQUE IOHEXOL 300 MG/ML  SOLN

[Series 2: abd/pel w · axial · 0.68mm/px · z∈[-473,-73]mm · 13 of 88 slices shown, 15 images]
[im 4/88  soft-tissue]
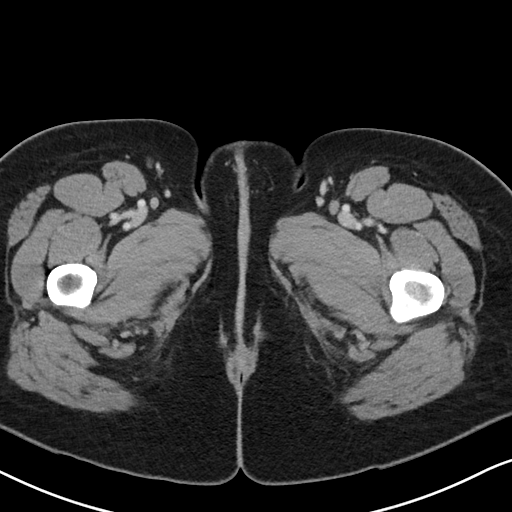
[im 4/88  bone]
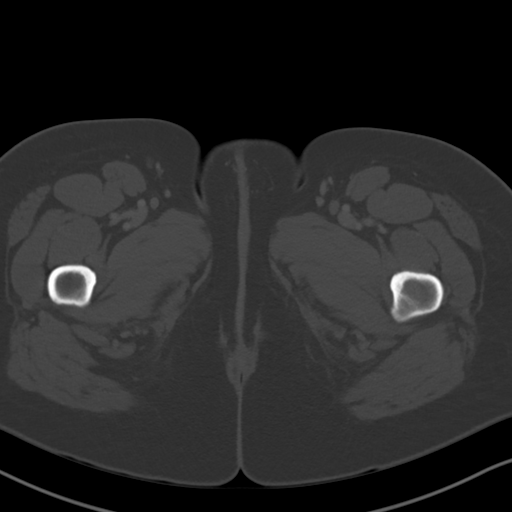
[im 11/88  soft-tissue]
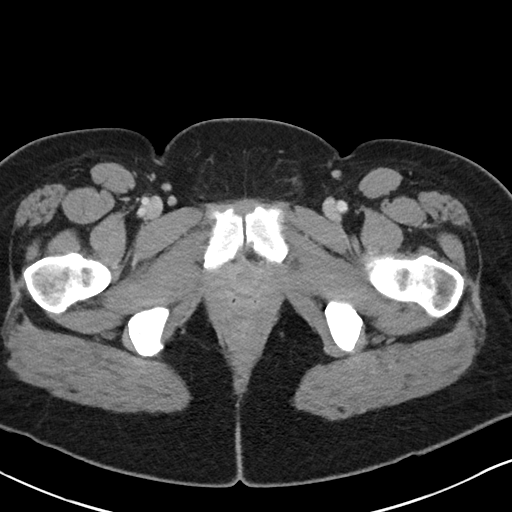
[im 19/88  soft-tissue]
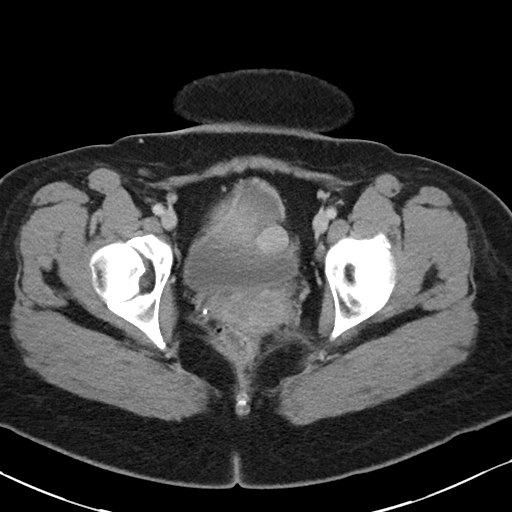
[im 26/88  soft-tissue]
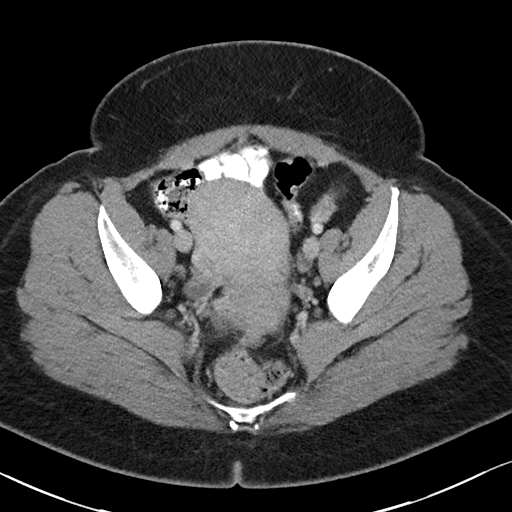
[im 30/88  soft-tissue]
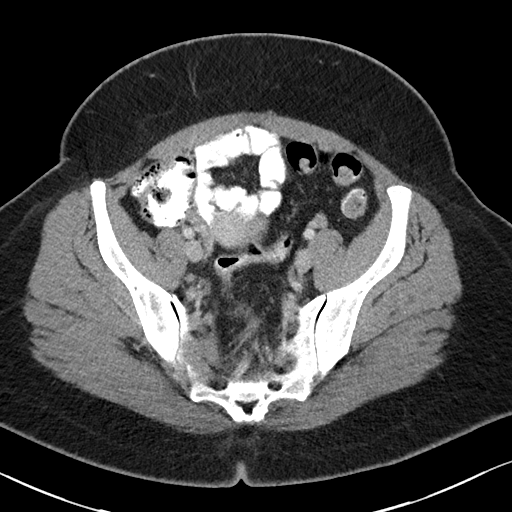
[im 37/88  soft-tissue]
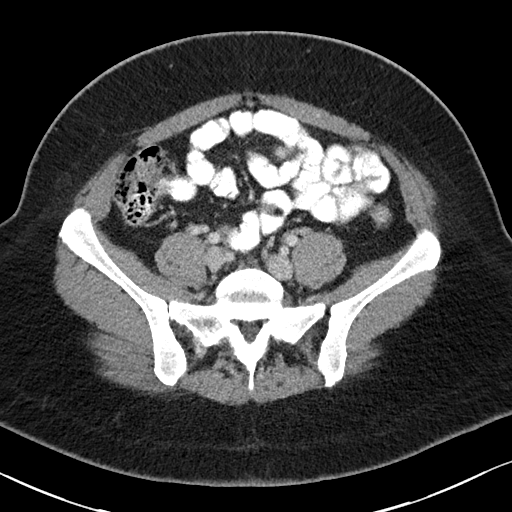
[im 44/88  soft-tissue]
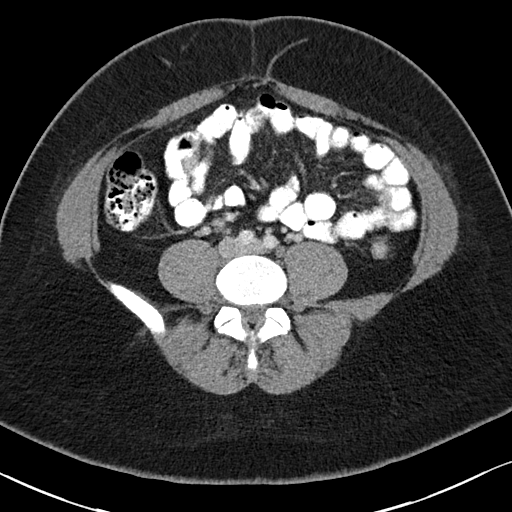
[im 51/88  soft-tissue]
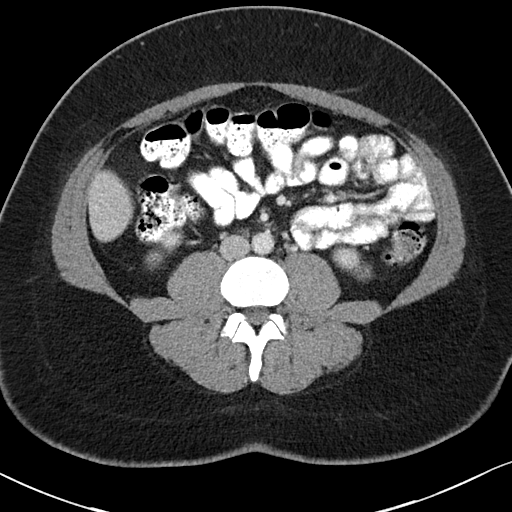
[im 59/88  soft-tissue]
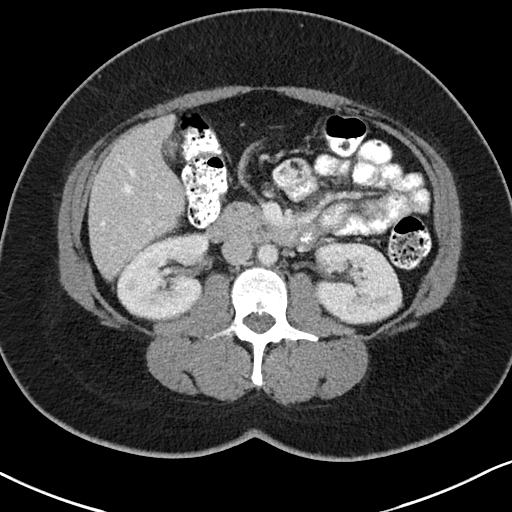
[im 59/88  bone]
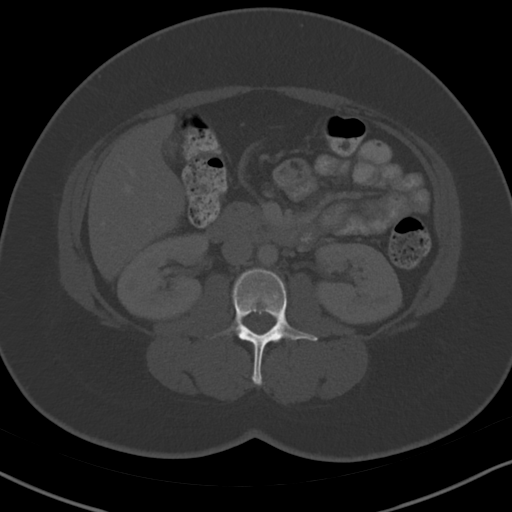
[im 62/88  soft-tissue]
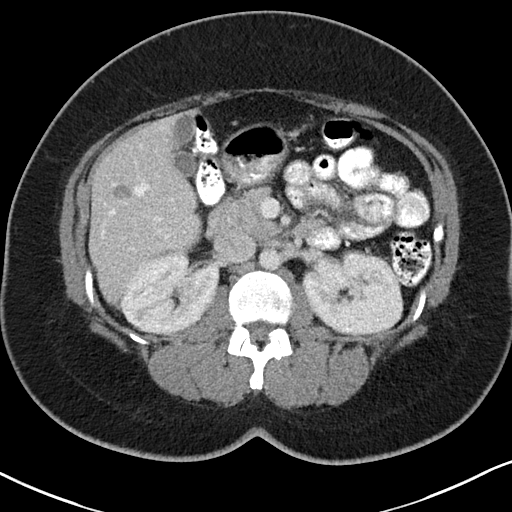
[im 69/88  soft-tissue]
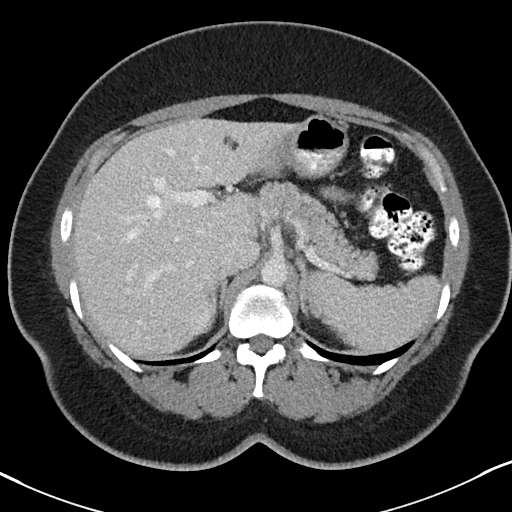
[im 77/88  soft-tissue]
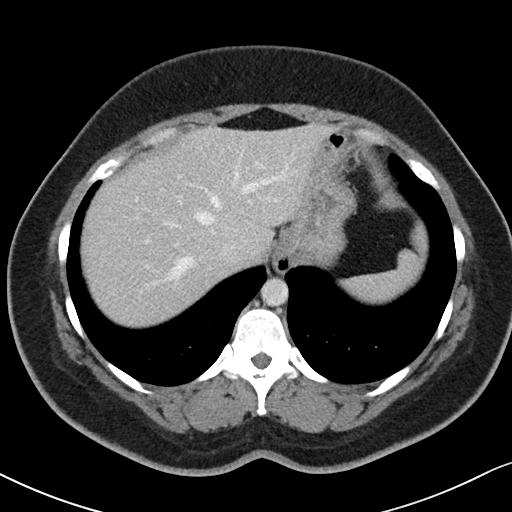
[im 84/88  soft-tissue]
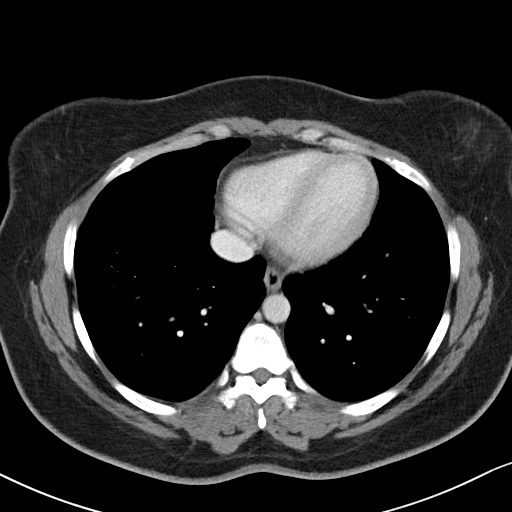

[Series 5: abd/pel w st · coronal · 0.67mm/px · 3 of 88 slices shown]
[im 30/88  soft-tissue]
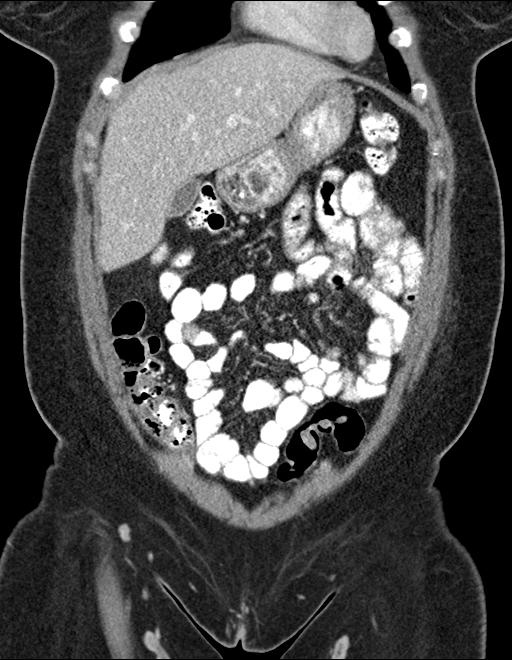
[im 39/88  soft-tissue]
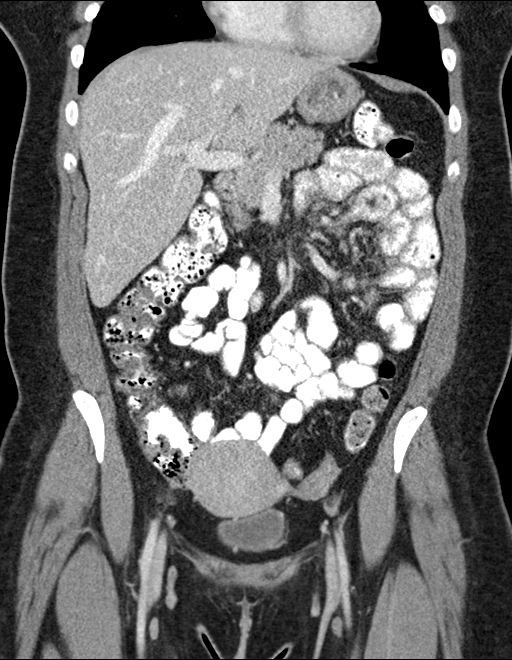
[im 49/88  soft-tissue]
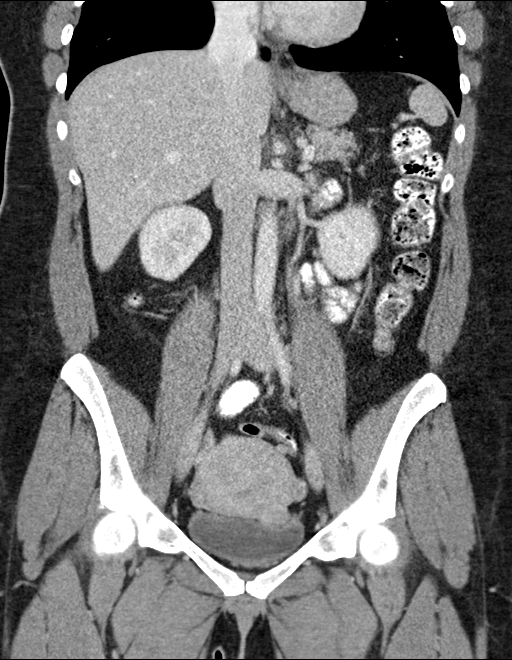

[16 of 46 positions shown; findings below may reference images not displayed]

FINDINGS: Lower chest: The visualized lung bases are clear.

No intra-abdominal free air or free fluid.

Hepatobiliary: There is a 12 mm hypodense lesion in the right lobe
of the liver (series 2, image 27) which is similar to prior CT. This
lesion is not characterized on this CT. Given the patient's young
age, provided no history of primary malignancy, this may represent a
complex cyst or hemangioma. Clinical correlation is recommended.
This can be better characterized with MRI without and with contrast
on a nonemergent basis. No intrahepatic biliary ductal dilatation.
The gallbladder is unremarkable.

Pancreas: Unremarkable. No pancreatic ductal dilatation or
surrounding inflammatory changes.

Spleen: Normal in size without focal abnormality.

Adrenals/Urinary Tract: Adrenal glands are unremarkable. Kidneys are
normal, without renal calculi, focal lesion, or hydronephrosis.
Bladder is unremarkable.

Stomach/Bowel: Stomach is within normal limits. Appendix appears
normal. No evidence of bowel wall thickening, distention, or
inflammatory changes.

Vascular/Lymphatic: No significant vascular findings are present. No
enlarged abdominal or pelvic lymph nodes.

Reproductive: The uterus is anteverted. Multiple uterine fibroids
noted. The largest fibroid appears to be a posterior fundal 4.5 cm
with apparent submucosal component. Ultrasound may provide better
evaluation of the pelvic structure on a nonemergent basis if
clinically indicated. The ovaries are unremarkable.

Other: None

Musculoskeletal: No acute or significant osseous findings.
IMPRESSION: 1. No acute intra-abdominal or pelvic pathology. No bowel
obstruction or active inflammation. Normal appendix.
2. Uterine fibroids.

## 2019-10-14 ENCOUNTER — Other Ambulatory Visit: Payer: Self-pay

## 2019-10-14 ENCOUNTER — Telehealth: Payer: Self-pay | Admitting: General Surgery

## 2019-10-14 ENCOUNTER — Ambulatory Visit: Payer: 59 | Admitting: Nurse Practitioner

## 2019-10-14 ENCOUNTER — Encounter: Payer: Self-pay | Admitting: Nurse Practitioner

## 2019-10-14 VITALS — BP 126/78 | HR 70 | Temp 98.3°F | Ht 65.0 in | Wt 217.0 lb

## 2019-10-14 DIAGNOSIS — K5900 Constipation, unspecified: Secondary | ICD-10-CM | POA: Diagnosis not present

## 2019-10-14 DIAGNOSIS — R1031 Right lower quadrant pain: Secondary | ICD-10-CM

## 2019-10-14 DIAGNOSIS — Z01818 Encounter for other preprocedural examination: Secondary | ICD-10-CM | POA: Diagnosis not present

## 2019-10-14 DIAGNOSIS — G8929 Other chronic pain: Secondary | ICD-10-CM

## 2019-10-14 DIAGNOSIS — K769 Liver disease, unspecified: Secondary | ICD-10-CM | POA: Diagnosis not present

## 2019-10-14 MED ORDER — NA SULFATE-K SULFATE-MG SULF 17.5-3.13-1.6 GM/177ML PO SOLN: 1.0000 | Freq: Once | ORAL | 0 refills | Status: AC

## 2019-10-14 MED ORDER — DICYCLOMINE HCL 10 MG PO CAPS: ORAL_CAPSULE | ORAL | 0 refills | Status: DC

## 2019-10-14 NOTE — Patient Instructions (Signed)
If you are age 35 or older, your body mass index should be between 23-30. Your Body mass index is 36.11 kg/m. If this is out of the aforementioned range listed, please consider follow up with your Primary Care Provider.  If you are age 43 or younger, your body mass index should be between 19-25. Your Body mass index is 36.11 kg/m. If this is out of the aformentioned range listed, please consider follow up with your Primary Care Provider.   You have been scheduled for a colonoscopy. Please follow written instructions given to you at your visit today.  Please pick up your prep supplies at the pharmacy within the next 1-3 days. If you use inhalers (even only as needed), please bring them with you on the day of your procedure.   We have sent the following medications to your pharmacy for you to pick up at your convenience: Suprep Bentyl 10 mg   Please [place] Miralax daily as needed We will contact you with a date for an MRI of your abdomen.  Due to recent changes in healthcare laws, you may see the results of your imaging and laboratory studies on MyChart before your provider has had a chance to review them.  We understand that in some cases there may be results that are confusing or concerning to you. Not all laboratory results come back in the same time frame and the provider may be waiting for multiple results in order to interpret others.  Please give [place] 48 hours in order for your provider to thoroughly review all the results before contacting the office for clarification of your results.

## 2019-10-14 NOTE — Telephone Encounter (Signed)
Left a voicemail for patient to call office regarding MRI.

## 2019-10-14 NOTE — Progress Notes (Signed)
10/14/2019 [name] [id number] [birth year]   Chief Complaint:  Lower right abdominal pain/back pain   History of Present Illness: [name] is a 34 year old female with a past medical history of hypertension, uterine fibroids, GERD and chronic RLQ and back pain. Past C section. She was initially seen in our office by Dr. Tarri Glenn on 06/13/2019 for further evaluation for RLQ abdominal/pelvic pain. She underwent an abdominal/pelvic CT with contrast 03/28/2019 which showed a 53mm right liver lesion (too small to further characterize), no acute intra-abdominal or pelvic pathology and uterine fibroids. She was evaluated by her gynecologist 07/22/2019 without significant findings.   She presents today for further follow up. She continues to have intermittent RLQ abdominal pain that radiates to her right lower back and sometimes radiates to her right groin area. This pain does not radiate down her leg. No associated numbness or tingling. She describes her RLQ pain as an annoying nagging pain that comes and goes and is now occurring 3 to 4 days weekly, previously occurred once or twice monthly. This pain lasts for a few hours. Eating makes the pain go away briefly but when she is done eating the pain recurs. Passing a BM does not change her RLQ pain. She was previously constipated, passing a normal formed stool every 2 to 3 days. However, she is passing a normal solid stool for the past few weeks. No rectal bleeding or black stools. She has occasional abdominal bloat. She has increased burping at times. She has heartburn only after eating large amounts of food. She has gained 12lbs over the past year which she attributes to eating more while working at home during the [employer]. She often eats fried fatty foods such as fried chicken, shrimp and fish. She eats salads a few days weekly. She snacks on salted peanuts. She infrequently takes Ibuprofen 400mg  PRN for ache and pains, mabye once  monthly. No history of back injury or surgery. Never had a lumbar/sacral MRI. No family history of colon, liver or pancreatic cancer.  She is frustrated regarding the chronic nature of her RLQ pain. She wishes to schedule a colonoscopy to rule out any disease to her colon which may be contributing to her intermittent abdominal pain and bloat.   She had 2 sets of labs done by her Dr. Ashby Dawes within the past 1 to 2 months. I will request a copy of theses results for further review.    Abdominal/Pelvic CT with contrast 03/28/2019: Hepatobiliary: There is a 12 mm hypodense lesion in the right lobe of the liver (series 2, image 27) which is similar to prior CT. This lesion is not characterized on this CT. Given the patient's young age, provided no history of primary malignancy, this may represent a complex cyst or hemangioma. Clinical correlation is recommended. This can be better characterized with MRI without and with contrast on a nonemergent basis. No intrahepatic biliary ductal dilatation. The gallbladder is unremarkable 1. No acute intra-abdominal or pelvic pathology. No bowel obstruction or active inflammation. Normal appendix. 2. Uterine fibroids.  Current Outpatient Medications on File Prior to Visit  Medication Sig Dispense Refill  . amLODipine (NORVASC) 5 MG tablet Take 5 mg by mouth daily.    Marland Kitchen etonogestrel (NEXPLANON) 68 MG IMPL implant 1 each by Subdermal route once.    Marland Kitchen ibuprofen (ADVIL,MOTRIN) 200 MG tablet Take 400 mg by mouth daily as needed for mild pain.    . vitamin C (ASCORBIC ACID) 500 MG  tablet Take 500 mg by mouth daily.     No current facility-administered medications on file prior to visit.   No Known Allergies    Current Medications, Allergies, Past Medical History, Past Surgical History, Family History and Social History were reviewed in [employer] record.   Physical Exam: BP 126/78   Pulse 70   Temp 98.3 F (36.8 C)   Ht 5'  5" (1.651 m)   Wt 217 lb (98.4 kg)   BMI 36.11 kg/m  General: Well developed  33 year old female in no acute distress. Head: Normocephalic and atraumatic. Eyes:  No scleral icterus. Conjunctiva pink . Ears: Normal auditory acuity. Lungs: Clear throughout to auscultation. Heart: Regular rate and rhythm, no murmur. Abdomen: Soft, nontender and nondistended. No masses or hepatomegaly. Normal bowel sounds x 4 quadrants.  Rectal: Deferred.  Musculoskeletal: Symmetrical with no gross deformities. Extremities: No edema. Neurological: Alert oriented x 4. No focal deficits.  Psychological:  Alert and cooperative. Normal mood and affect  Assessment and Recommendations:  67. 34 year old female with chronic RLQ abdominal pain of unclear etiology. Possible IBS vs adhesions from past C section. I discussed with the patient there is a low diagnostic yield when a colonoscopy is done primarily for localized abdominal pain, however, she accepts the benefits and risks and she wishes to proceed.  If her colonoscopy is normal, further IBS treatment to be prescribed.  -Diagnostic colonoscopy to rule out IBD and colon malignancy. Colonoscopy benefits and risks discussed including risk with sedation, risk of bleeding, perforation and infection  -Dicyclomine 10mg  1 tab po Q 6 to 8 hrs prn -Reduce fatty/fried food intake -Weight loss recommended  -Request copy of labs from PCP's office   2. Right liver lesion -Abdominal MRI with contrast   3. Uterine fibroids, followed by gyn   Further follow up to be determined after the above evaluation completed

## 2019-10-15 NOTE — Telephone Encounter (Signed)
Notified the patient of her appt at [facility] for her MRI, patient verbalized understanding.

## 2019-10-21 ENCOUNTER — Other Ambulatory Visit: Payer: Self-pay

## 2019-10-21 ENCOUNTER — Ambulatory Visit (HOSPITAL_COMMUNITY)
Admission: RE | Admit: 2019-10-21 | Discharge: 2019-10-21 | Disposition: A | Discharge status: Home / Self Care | Payer: 59 | Source: Ambulatory Visit | Attending: Nurse Practitioner | Admitting: Nurse Practitioner

## 2019-10-21 DIAGNOSIS — R1031 Right lower quadrant pain: Secondary | ICD-10-CM

## 2019-10-21 DIAGNOSIS — K769 Liver disease, unspecified: Secondary | ICD-10-CM

## 2019-10-21 DIAGNOSIS — G8929 Other chronic pain: Secondary | ICD-10-CM | POA: Diagnosis present

## 2019-10-21 LAB — POCT I-STAT CREATININE: Creatinine, Ser: 1 mg/dL (ref 0.44–1.00)

## 2019-10-21 MED ORDER — GADOBUTROL 1 MMOL/ML IV SOLN
10.0000 mL | Freq: Once | INTRAVENOUS | Status: AC | PRN
  Administered 2019-10-21: 10 mL via INTRAVENOUS

## 2019-10-22 ENCOUNTER — Telehealth: Payer: Self-pay | Admitting: Nurse Practitioner

## 2019-10-22 NOTE — Telephone Encounter (Signed)
Please review and advise.

## 2019-10-22 NOTE — Telephone Encounter (Signed)
I called patient, see MRI side notes

## 2019-10-23 NOTE — Progress Notes (Signed)
Reviewed and agree with management plans. ? ?Shy Guallpa L. Lasheka Kempner, MD, MPH  ?

## 2019-11-14 ENCOUNTER — Encounter: Payer: Self-pay | Admitting: Gastroenterology

## 2019-11-17 ENCOUNTER — Other Ambulatory Visit: Payer: Self-pay | Admitting: Gastroenterology

## 2019-11-17 ENCOUNTER — Ambulatory Visit (INDEPENDENT_AMBULATORY_CARE_PROVIDER_SITE_OTHER): Payer: 59

## 2019-11-17 DIAGNOSIS — Z1159 Encounter for screening for other viral diseases: Secondary | ICD-10-CM

## 2019-11-18 LAB — SARS CORONAVIRUS 2 (TAT 6-24 HRS): SARS Coronavirus 2: NEGATIVE

## 2019-11-19 ENCOUNTER — Ambulatory Visit (AMBULATORY_SURGERY_CENTER): Payer: 59 | Admitting: Gastroenterology

## 2019-11-19 ENCOUNTER — Other Ambulatory Visit: Payer: Self-pay

## 2019-11-19 ENCOUNTER — Encounter: Payer: Self-pay | Admitting: Gastroenterology

## 2019-11-19 VITALS — BP 119/72 | HR 90 | Temp 96.9°F | Resp 14 | Ht 65.0 in | Wt 217.0 lb

## 2019-11-19 DIAGNOSIS — D122 Benign neoplasm of ascending colon: Secondary | ICD-10-CM

## 2019-11-19 DIAGNOSIS — R1031 Right lower quadrant pain: Secondary | ICD-10-CM | POA: Diagnosis not present

## 2019-11-19 DIAGNOSIS — D120 Benign neoplasm of cecum: Secondary | ICD-10-CM

## 2019-11-19 MED ORDER — SODIUM CHLORIDE 0.9 % IV SOLN: 500.0000 mL | Freq: Once | INTRAVENOUS | Status: DC

## 2019-11-19 NOTE — Progress Notes (Signed)
Called to room to assist during endoscopic procedure.  Patient ID and intended procedure confirmed with present staff. Received instructions for my participation in the procedure from the performing physician.  

## 2019-11-19 NOTE — Patient Instructions (Signed)
Please read handouts provided. Continue present medications. Await pathology results. High Fiber diet.     YOU HAD AN ENDOSCOPIC PROCEDURE TODAY AT THE [facility]:   Refer to the procedure report that was given to you for any specific questions about what was found during the examination.  If the procedure report does not answer your questions, please call your gastroenterologist to clarify.  If you requested that your care partner not be given the details of your procedure findings, then the procedure report has been included in a sealed envelope for you to review at your convenience later.  YOU SHOULD EXPECT: Some feelings of bloating in the abdomen. Passage of more gas than usual.  Walking can help get rid of the air that was put into your GI tract during the procedure and reduce the bloating. If you had a lower endoscopy (such as a colonoscopy or flexible sigmoidoscopy) you may notice spotting of blood in your stool or on the toilet paper. If you underwent a bowel prep for your procedure, you may not have a normal bowel movement for a few days.  Please Note:  You might notice some irritation and congestion in your nose or some drainage.  This is from the oxygen used during your procedure.  There is no need for concern and it should clear up in a day or so.  SYMPTOMS TO REPORT IMMEDIATELY:   Following lower endoscopy (colonoscopy or flexible sigmoidoscopy):  Excessive amounts of blood in the stool  Significant tenderness or worsening of abdominal pains  Swelling of the abdomen that is new, acute  Fever of 100F or higher   For urgent or emergent issues, a gastroenterologist can be reached at any hour by calling [phone]. Do not use MyChart messaging for urgent concerns.    DIET:  We do recommend a small meal at first, but then you may proceed to your regular diet.  Drink plenty of fluids but you should avoid alcoholic beverages for 24 hours.  ACTIVITY:  You should  plan to take it easy for the rest of today and you should NOT DRIVE or use heavy machinery until tomorrow (because of the sedation medicines used during the test).    FOLLOW UP: Our staff will call the number listed on your records 48-72 hours following your procedure to check on you and address any questions or concerns that you may have regarding the information given to you following your procedure. If we do not reach you, we will leave a message.  We will attempt to reach you two times.  During this call, we will ask if you have developed any symptoms of COVID 19. If you develop any symptoms (ie: fever, flu-like symptoms, shortness of breath, cough etc.) before then, please call [phone].  If you test positive for Covid 19 in the 2 weeks post procedure, please call and report this information to us.    If any biopsies were taken you will be contacted by phone or by letter within the next 1-3 weeks.  Please call us at [phone] if you have not heard about the biopsies in 3 weeks.    SIGNATURES/CONFIDENTIALITY: You and/or your care partner have signed paperwork which will be entered into your electronic medical record.  These signatures attest to the fact that that the information above on your After Visit Summary has been reviewed and is understood.  Full responsibility of the confidentiality of this discharge information lies with you and/or your care-partner.   care-partner.

## 2019-11-19 NOTE — Progress Notes (Signed)
Temp-Lisa Clapps VS- Nash Mantis  Pt states her left upper chest is aching, rating as a"2." She states, "this happens when my BP is too high."  She took her Norvasc this am.  No SOB or nausea, neck or jaw pain.  Riki Sheer CRNA and Dr. Tarri Glenn made aware  911- BP rechecked 128/77.  Pt is denies any discomfort of any kind now.

## 2019-11-19 NOTE — Progress Notes (Signed)
Pt Drowsy. VSS. To PACU, report to RN. No anesthetic complications noted.  

## 2019-11-19 NOTE — Op Note (Signed)
[facility] Patient Name: [name] Procedure Date: 11/19/2019 9:27 AM MRN: [id number] Endoscopist: Thornton Park MD, MD Age: 34 Referring MD:  Date of Birth: 25-Jul-1986 Gender: Female Account #: [id number] Procedure:                Colonoscopy Indications:              Abdominal pain in the right lower quadrant                           Father with colon polyps Medicines:                Monitored Anesthesia Care Procedure:                Pre-Anesthesia Assessment:                           - Prior to the procedure, a History and Physical                            was performed, and patient medications and                            allergies were reviewed. The patient's tolerance of                            previous anesthesia was also reviewed. The risks                            and benefits of the procedure and the sedation                            options and risks were discussed with the patient.                            All questions were answered, and informed consent                            was obtained. Prior Anticoagulants: The patient has                            taken no previous anticoagulant or antiplatelet                            agents. ASA Grade Assessment: II - A patient with                            mild systemic disease. After reviewing the risks                            and benefits, the patient was deemed in                            satisfactory condition to undergo the procedure.  After obtaining informed consent, the colonoscope                            was passed under direct vision. Throughout the                            procedure, the patient's blood pressure, pulse, and                            oxygen saturations were monitored continuously. The                            Colonoscope was introduced through the anus and                            advanced to the 12 cm into the ileum. The                             colonoscopy was performed without difficulty. The                            patient tolerated the procedure well. The quality                            of the bowel preparation was good. Scope In: 9:42:31 AM Scope Out: 9:54:08 AM Scope Withdrawal Time: 0 hours 9 minutes 22 seconds  Total Procedure Duration: 0 hours 11 minutes 37 seconds  Findings:                 The perianal and digital rectal examinations were                            normal.                           A 6-7 mm polyp was found in the ascending colon.                            The polyp was sessile. The polyp was removed with a                            piecemeal technique using a cold snare. Resection                            and retrieval were complete. Estimated blood loss                            was minimal.                           A 3 mm polyp was found in the cecum. The polyp was                            sessile. The polyp was removed with a  cold snare.                            Resection and retrieval were complete. Estimated                            blood loss was minimal.                           The colon (entire examined portion) appeared normal.                           The terminal ileum appeared normal.                           The exam was otherwise without abnormality on                            direct and retroflexion views. Complications:            No immediate complications. Estimated blood loss:                            Minimal. Estimated Blood Loss:     Estimated blood loss was minimal. Impression:               - One 6-7 mm polyp in the ascending colon, removed                            piecemeal using a cold snare. Resected and                            retrieved.                           - One 3 mm polyp in the cecum, removed with a cold                            snare. Resected and retrieved.                           - No source for  abdominal pain identified. Recommendation:           - Patient has a contact number available for                            emergencies. The signs and symptoms of potential                            delayed complications were discussed with the                            patient. Return to normal activities tomorrow.                            Written discharge instructions were provided to the  patient.                           - Resume previous diet.                           - Continue present medications.                           - Await pathology results.                           - Repeat colonoscopy date to be determined after                            pending pathology results are reviewed for                            surveillance.                           - Emerging evidence supports eating a diet of                            fruits, vegetables, grains, calcium, and yogurt                            while reducing red meat and alcohol may reduce the                            risk of colon cancer. Thornton Park MD, MD 11/19/2019 10:00:29 AM This report has been signed electronically.

## 2019-11-21 ENCOUNTER — Telehealth: Payer: Self-pay | Admitting: *Deleted

## 2019-11-21 ENCOUNTER — Encounter: Payer: Self-pay | Admitting: Gastroenterology

## 2019-11-21 ENCOUNTER — Telehealth: Payer: Self-pay

## 2019-11-21 NOTE — Telephone Encounter (Signed)
First post procedure follow up call, no answer 

## 2019-11-21 NOTE — Telephone Encounter (Signed)
  Follow up Call-  Call back number 11/19/2019  Post procedure Call Back phone  # [phone]  Permission to leave phone message Yes  Some recent data might be hidden     Patient questions:  Do you have a fever, pain , or abdominal swelling? No. Pain Score  0 *  Have you tolerated food without any problems? Yes.    Have you been able to return to your normal activities? Yes.    Do you have any questions about your discharge instructions: Diet   No. Medications  No. Follow up visit  No.  Do you have questions or concerns about your Care? No.  Actions: * If pain score is 4 or above: No action needed, pain <4.  1. Have you developed a fever since your procedure? no  2.   Have you had an respiratory symptoms (SOB or cough) since your procedure? no  3.   Have you tested positive for COVID 19 since your procedure no  4.   Have you had any family members/close contacts diagnosed with the COVID 19 since your procedure?  no   If yes to any of these questions please route to Joylene John, RN and Erenest Rasher, RN

## 2020-04-29 ENCOUNTER — Telehealth: Payer: Self-pay | Admitting: General Surgery

## 2020-04-29 DIAGNOSIS — K769 Liver disease, unspecified: Secondary | ICD-10-CM

## 2020-04-29 NOTE — Telephone Encounter (Signed)
-----   Message from Mohammed Kindle, RN sent at 10/28/2019 10:42 AM EDT ----- Regarding: repeat MRI [facility]ie, please place an order and notify this patient of the need for a repeat abd MRI per Jaclyn Shaggy, NP to be completed 6 months from previous scan (previous scan 09/2019) Thanks Bre

## 2020-04-29 NOTE — Telephone Encounter (Signed)
Contacted Radiology spoke with [facility]- scheduled patient at [facility] 05/12/2020@9 :30am

## 2020-05-12 ENCOUNTER — Ambulatory Visit (HOSPITAL_COMMUNITY): Payer: 59

## 2020-09-07 DIAGNOSIS — R002 Palpitations: Secondary | ICD-10-CM | POA: Diagnosis not present

## 2021-02-22 ENCOUNTER — Emergency Department (HOSPITAL_COMMUNITY)
Admission: EM | Admit: 2021-02-22 | Discharge: 2021-02-22 | Discharge status: Against Medical Advice | Payer: BLUE CROSS/BLUE SHIELD

## 2021-02-22 ENCOUNTER — Other Ambulatory Visit: Payer: Self-pay

## 2021-10-13 DIAGNOSIS — J358 Other chronic diseases of tonsils and adenoids: Secondary | ICD-10-CM | POA: Diagnosis not present

## 2021-10-13 DIAGNOSIS — J343 Hypertrophy of nasal turbinates: Secondary | ICD-10-CM | POA: Diagnosis not present

## 2021-10-17 DIAGNOSIS — J301 Allergic rhinitis due to pollen: Secondary | ICD-10-CM | POA: Diagnosis not present

## 2021-10-17 DIAGNOSIS — J3081 Allergic rhinitis due to animal (cat) (dog) hair and dander: Secondary | ICD-10-CM | POA: Diagnosis not present

## 2021-10-17 DIAGNOSIS — Z91013 Allergy to seafood: Secondary | ICD-10-CM | POA: Diagnosis not present

## 2021-10-17 DIAGNOSIS — J3089 Other allergic rhinitis: Secondary | ICD-10-CM | POA: Diagnosis not present

## 2021-11-18 DIAGNOSIS — M2550 Pain in unspecified joint: Secondary | ICD-10-CM | POA: Diagnosis not present

## 2021-11-18 DIAGNOSIS — M25561 Pain in right knee: Secondary | ICD-10-CM | POA: Diagnosis not present

## 2021-11-21 DIAGNOSIS — R002 Palpitations: Secondary | ICD-10-CM | POA: Diagnosis not present

## 2021-12-13 DIAGNOSIS — R300 Dysuria: Secondary | ICD-10-CM | POA: Diagnosis not present

## 2021-12-13 DIAGNOSIS — Z113 Encounter for screening for infections with a predominantly sexual mode of transmission: Secondary | ICD-10-CM | POA: Diagnosis not present

## 2021-12-13 DIAGNOSIS — N760 Acute vaginitis: Secondary | ICD-10-CM | POA: Diagnosis not present

## 2021-12-21 DIAGNOSIS — Z23 Encounter for immunization: Secondary | ICD-10-CM | POA: Diagnosis not present

## 2022-01-18 ENCOUNTER — Encounter (HOSPITAL_BASED_OUTPATIENT_CLINIC_OR_DEPARTMENT_OTHER): Payer: Self-pay | Admitting: Obstetrics and Gynecology

## 2022-01-18 NOTE — Progress Notes (Signed)
Spoke w/ via phone for pre-op interview--- [place]---- HCG serum, T&S, EKG and CBC              Lab results------ COVID test -----patient states asymptomatic no test needed Arrive at -------0530 NPO after MN NO Solid Food.   Med rec completed Medications to take morning of surgery ----- Norvasc, Metoprolol and Hydroxyzine Diabetic medication ----- Patient instructed no nail polish to be worn day of surgery Patient instructed to bring photo id and insurance card day of surgery Patient aware to have Driver (ride ) / caregiver   Sister Marcile Fuquay for 24 hours after surgery  Patient Special Instructions ----- Pre-Op special Istructions ----- Patient verbalized understanding of instructions that were given at this phone interview. Patient denies shortness of breath, chest pain, fever, cough at this phone interview.

## 2022-01-19 DIAGNOSIS — R102 Pelvic and perineal pain: Secondary | ICD-10-CM | POA: Diagnosis not present

## 2022-01-19 DIAGNOSIS — D259 Leiomyoma of uterus, unspecified: Secondary | ICD-10-CM | POA: Diagnosis not present

## 2022-01-19 DIAGNOSIS — N920 Excessive and frequent menstruation with regular cycle: Secondary | ICD-10-CM | POA: Diagnosis not present

## 2022-01-23 ENCOUNTER — Ambulatory Visit (HOSPITAL_BASED_OUTPATIENT_CLINIC_OR_DEPARTMENT_OTHER): Payer: Federal, State, Local not specified - PPO | Admitting: Anesthesiology

## 2022-01-23 ENCOUNTER — Other Ambulatory Visit: Payer: Self-pay

## 2022-01-23 ENCOUNTER — Encounter (HOSPITAL_BASED_OUTPATIENT_CLINIC_OR_DEPARTMENT_OTHER): Payer: Self-pay | Admitting: Obstetrics and Gynecology

## 2022-01-23 ENCOUNTER — Ambulatory Visit (HOSPITAL_BASED_OUTPATIENT_CLINIC_OR_DEPARTMENT_OTHER)
Admission: RE | Admit: 2022-01-23 | Discharge: 2022-01-23 | Disposition: A | Discharge status: Home / Self Care | Payer: Federal, State, Local not specified - PPO | Attending: Obstetrics and Gynecology | Admitting: Obstetrics and Gynecology

## 2022-01-23 ENCOUNTER — Encounter (HOSPITAL_BASED_OUTPATIENT_CLINIC_OR_DEPARTMENT_OTHER)
Admission: RE | Disposition: A | Discharge status: Home / Self Care | Payer: Self-pay | Source: Home / Self Care | Attending: Obstetrics and Gynecology

## 2022-01-23 DIAGNOSIS — K219 Gastro-esophageal reflux disease without esophagitis: Secondary | ICD-10-CM | POA: Insufficient documentation

## 2022-01-23 DIAGNOSIS — N920 Excessive and frequent menstruation with regular cycle: Secondary | ICD-10-CM | POA: Insufficient documentation

## 2022-01-23 DIAGNOSIS — N941 Unspecified dyspareunia: Secondary | ICD-10-CM | POA: Insufficient documentation

## 2022-01-23 DIAGNOSIS — D259 Leiomyoma of uterus, unspecified: Secondary | ICD-10-CM | POA: Diagnosis not present

## 2022-01-23 DIAGNOSIS — I10 Essential (primary) hypertension: Secondary | ICD-10-CM | POA: Insufficient documentation

## 2022-01-23 DIAGNOSIS — R102 Pelvic and perineal pain: Secondary | ICD-10-CM | POA: Diagnosis not present

## 2022-01-23 DIAGNOSIS — Z01818 Encounter for other preprocedural examination: Secondary | ICD-10-CM

## 2022-01-23 HISTORY — DX: Sleep apnea, unspecified: G47.30

## 2022-01-23 HISTORY — PX: DILATATION & CURETTAGE/HYSTEROSCOPY WITH MYOSURE: SHX6511

## 2022-01-23 HISTORY — DX: Anxiety disorder, unspecified: F41.9

## 2022-01-23 LAB — HCG, SERUM, QUALITATIVE: Preg, Serum: NEGATIVE

## 2022-01-23 LAB — CBC
HCT: 39.7 % (ref 36.0–46.0)
Hemoglobin: 12.4 g/dL (ref 12.0–15.0)
MCH: 23.8 pg — ABNORMAL LOW (ref 26.0–34.0)
MCHC: 31.2 g/dL (ref 30.0–36.0)
MCV: 76.1 fL — ABNORMAL LOW (ref 80.0–100.0)
Platelets: 238 10*3/uL (ref 150–400)
RBC: 5.22 MIL/uL — ABNORMAL HIGH (ref 3.87–5.11)
RDW: 15 % (ref 11.5–15.5)
WBC: 5.4 10*3/uL (ref 4.0–10.5)
nRBC: 0 % (ref 0.0–0.2)

## 2022-01-23 LAB — TYPE AND SCREEN
ABO/RH(D): O POS
Antibody Screen: NEGATIVE

## 2022-01-23 SURGERY — DILATATION & CURETTAGE/HYSTEROSCOPY WITH MYOSURE
Anesthesia: General | Site: Uterus

## 2022-01-23 MED ORDER — FENTANYL CITRATE (PF) 100 MCG/2ML IJ SOLN
INTRAMUSCULAR | Status: AC
  Filled 2022-01-23: qty 2

## 2022-01-23 MED ORDER — OXYCODONE HCL 5 MG PO TABS
ORAL_TABLET | ORAL | Status: AC
  Filled 2022-01-23: qty 1

## 2022-01-23 MED ORDER — SODIUM CHLORIDE 0.9 % IV SOLN
2.0000 g | INTRAVENOUS | Status: AC
  Administered 2022-01-23: 2 g via INTRAVENOUS

## 2022-01-23 MED ORDER — PROPOFOL 10 MG/ML IV BOLUS
INTRAVENOUS | Status: AC
  Filled 2022-01-23: qty 20

## 2022-01-23 MED ORDER — MIDAZOLAM HCL 2 MG/2ML IJ SOLN
INTRAMUSCULAR | Status: AC
  Filled 2022-01-23: qty 2

## 2022-01-23 MED ORDER — DEXAMETHASONE SODIUM PHOSPHATE 4 MG/ML IJ SOLN
INTRAMUSCULAR | Status: DC | PRN
  Administered 2022-01-23: 8 mg via INTRAVENOUS

## 2022-01-23 MED ORDER — ONDANSETRON HCL 4 MG/2ML IJ SOLN
INTRAMUSCULAR | Status: DC | PRN
  Administered 2022-01-23: 4 mg via INTRAVENOUS

## 2022-01-23 MED ORDER — OXYCODONE HCL 5 MG PO TABS
5.0000 mg | ORAL_TABLET | Freq: Once | ORAL | Status: AC | PRN
  Administered 2022-01-23: 5 mg via ORAL

## 2022-01-23 MED ORDER — SODIUM CHLORIDE 0.9 % IV SOLN
INTRAVENOUS | Status: AC
  Filled 2022-01-23: qty 2

## 2022-01-23 MED ORDER — FENTANYL CITRATE (PF) 100 MCG/2ML IJ SOLN
25.0000 ug | INTRAMUSCULAR | Status: DC | PRN
  Administered 2022-01-23: 25 ug via INTRAVENOUS

## 2022-01-23 MED ORDER — SODIUM CHLORIDE 0.9 % IR SOLN
Status: DC | PRN
  Administered 2022-01-23: 637 mL

## 2022-01-23 MED ORDER — SILVER NITRATE-POT NITRATE 75-25 % EX MISC
CUTANEOUS | Status: AC
  Filled 2022-01-23: qty 10

## 2022-01-23 MED ORDER — EPHEDRINE SULFATE (PRESSORS) 50 MG/ML IJ SOLN
INTRAMUSCULAR | Status: DC | PRN
  Administered 2022-01-23: 5 mg via INTRAVENOUS

## 2022-01-23 MED ORDER — POVIDONE-IODINE 10 % EX SWAB: 1.0000 | Freq: Once | CUTANEOUS | Status: DC

## 2022-01-23 MED ORDER — KETOROLAC TROMETHAMINE 30 MG/ML IJ SOLN: 30.0000 mg | Freq: Once | INTRAMUSCULAR | Status: DC | PRN

## 2022-01-23 MED ORDER — LIDOCAINE-EPINEPHRINE (PF) 1 %-1:200000 IJ SOLN
INTRAMUSCULAR | Status: DC | PRN
  Administered 2022-01-23: 10 mL

## 2022-01-23 MED ORDER — OXYCODONE HCL 5 MG/5ML PO SOLN: 5.0000 mg | Freq: Once | ORAL | Status: AC | PRN

## 2022-01-23 MED ORDER — WHITE PETROLATUM EX OINT
TOPICAL_OINTMENT | CUTANEOUS | Status: AC
  Filled 2022-01-23: qty 5

## 2022-01-23 MED ORDER — ONDANSETRON HCL 4 MG/2ML IJ SOLN: 4.0000 mg | Freq: Once | INTRAMUSCULAR | Status: DC | PRN

## 2022-01-23 MED ORDER — KETOROLAC TROMETHAMINE 30 MG/ML IJ SOLN
INTRAMUSCULAR | Status: DC | PRN
  Administered 2022-01-23: 30 mg via INTRAVENOUS

## 2022-01-23 MED ORDER — MIDAZOLAM HCL 2 MG/2ML IJ SOLN
INTRAMUSCULAR | Status: DC | PRN
  Administered 2022-01-23: 2 mg via INTRAVENOUS

## 2022-01-23 MED ORDER — PROPOFOL 10 MG/ML IV BOLUS
INTRAVENOUS | Status: DC | PRN
  Administered 2022-01-23: 150 mg via INTRAVENOUS

## 2022-01-23 MED ORDER — LIDOCAINE HCL (CARDIAC) PF 100 MG/5ML IV SOSY
PREFILLED_SYRINGE | INTRAVENOUS | Status: DC | PRN
  Administered 2022-01-23: 50 mg via INTRAVENOUS

## 2022-01-23 MED ORDER — GLYCOPYRROLATE 0.2 MG/ML IJ SOLN
INTRAMUSCULAR | Status: DC | PRN
  Administered 2022-01-23: .1 mg via INTRAVENOUS

## 2022-01-23 MED ORDER — LIDOCAINE-EPINEPHRINE (PF) 1 %-1:200000 IJ SOLN
INTRAMUSCULAR | Status: AC
  Filled 2022-01-23: qty 30

## 2022-01-23 MED ORDER — FENTANYL CITRATE (PF) 100 MCG/2ML IJ SOLN
INTRAMUSCULAR | Status: DC | PRN
  Administered 2022-01-23: 50 ug via INTRAVENOUS
  Administered 2022-01-23 (×2): 25 ug via INTRAVENOUS

## 2022-01-23 MED ORDER — LACTATED RINGERS IV SOLN: INTRAVENOUS | Status: DC

## 2022-01-23 SURGICAL SUPPLY — 35 items
BIPOLAR CUTTING LOOP 21FR (ELECTRODE)
CATH ROBINSON RED A/P 16FR (CATHETERS) ×1 IMPLANT
CNTNR URN SCR LID CUP LEK RST (MISCELLANEOUS) IMPLANT
CONT SPEC 4OZ STRL OR WHT (MISCELLANEOUS) ×2
DEVICE MYOSURE LITE (MISCELLANEOUS) IMPLANT
DEVICE MYOSURE REACH (MISCELLANEOUS) IMPLANT
DILATOR CANAL MILEX (MISCELLANEOUS) IMPLANT
DRSG TELFA 3X8 NADH (GAUZE/BANDAGES/DRESSINGS) IMPLANT
ELECT DISPERSIVE SONATA (ELECTRODE) ×4 IMPLANT
ELECT REM PT RETURN 9FT ADLT (ELECTROSURGICAL)
ELECTRODE REM PT RTRN 9FT ADLT (ELECTROSURGICAL) IMPLANT
GAUZE 4X4 16PLY ~~LOC~~+RFID DBL (SPONGE) ×2 IMPLANT
GLOVE BIO SURGEON STRL SZ 6.5 (GLOVE) ×1 IMPLANT
GLOVE BIO SURGEON STRL SZ8 (GLOVE) ×2 IMPLANT
GLOVE BIOGEL PI IND STRL 7.0 (GLOVE) ×2 IMPLANT
GLOVE BIOGEL PI INDICATOR 7.0 (GLOVE) ×1
GLOVE SURG ORTHO 8.0 STRL STRW (GLOVE) ×3 IMPLANT
GOWN STRL REUS W/TWL LRG LVL3 (GOWN DISPOSABLE) ×2 IMPLANT
GOWN STRL REUS W/TWL XL LVL3 (GOWN DISPOSABLE) ×2 IMPLANT
HANDPIECE RFA SONATA (MISCELLANEOUS) ×2 IMPLANT
IV NS IRRIG 3000ML ARTHROMATIC (IV SOLUTION) ×2 IMPLANT
KIT PROCEDURE FLUENT (KITS) ×2 IMPLANT
KIT TURNOVER CYSTO (KITS) ×2 IMPLANT
LOOP CUTTING BIPOLAR 21FR (ELECTRODE) IMPLANT
MYOSURE XL FIBROID (MISCELLANEOUS)
PACK VAGINAL MINOR WOMEN LF (CUSTOM PROCEDURE TRAY) ×2 IMPLANT
PAD DRESSING TELFA 3X8 NADH (GAUZE/BANDAGES/DRESSINGS) ×1 IMPLANT
PAD OB MATERNITY 4.3X12.25 (PERSONAL CARE ITEMS) ×2 IMPLANT
PAD PREP 24X48 CUFFED NSTRL (MISCELLANEOUS) ×2 IMPLANT
SEAL CERVICAL OMNI LOK (ABLATOR) IMPLANT
SEAL ROD LENS SCOPE MYOSURE (ABLATOR) ×2 IMPLANT
SYR 50ML LL SCALE MARK (SYRINGE) ×2 IMPLANT
SYSTEM TISS REMOVAL MYOSURE XL (MISCELLANEOUS) IMPLANT
TOWEL OR 17X26 10 PK STRL BLUE (TOWEL DISPOSABLE) ×2 IMPLANT
WATER STERILE IRR 500ML POUR (IV SOLUTION) ×2 IMPLANT

## 2022-01-23 NOTE — Op Note (Signed)
[name], [name] MEDICAL RECORD NO: [id number] ACCOUNT NO: [id number] DATE OF BIRTH: 29-Sep-1985 FACILITY: [facility] LOCATION: [facility] PHYSICIAN: Monia Sabal. Corinna Capra, MD  Operative Report   DATE OF PROCEDURE: 01/23/2022   PREOPERATIVE DIAGNOSES:  Menorrhagia, uterine leiomyoma, pelvic pain and dyspareunia.  POSTOPERATIVE DIAGNOSES:  Menorrhagia, uterine leiomyoma, pelvic pain and dyspareunia.   PROCEDURE:  Hysteroscopy with D and C and Sonata fibroid ablation.  SURGEON:  Dr. Louretta Shorten.  ANESTHESIA:  General endotracheal.  INDICATIONS:  The patient is a 36 year old black female with known uterine fibroids, the largest measuring 7 cm on the posterior wall and the two other that are 2.6 and 2.2 cm on the anterior wall.  She has continued to have menorrhagia, cannot take oral  contraceptive agents.  She was not approved to take Myfembree, she wants to preserve her uterus and does not want to proceed with myomectomy or hysterectomy and would like to proceed with a fibroid ablation and resection of fibroids if possible.  We  discussed the procedure at length its risks, its benefits, its success and complication rates.  She does give her informed consent and wished to proceed.  FINDINGS:  At the time of surgery, normal-appearing cavity, normal-appearing ostia and cervix.  Ultrasound guidance with the Sonata reveals a 7 cm posterior wall fibroid, which does push up on the uterine cavity.  The 2 cm fibroids were noted anteriorly  just below the bladder and appeared to be intramural in location.  DESCRIPTION OF PROCEDURE:  After adequate analgesia, the patient was placed in dorsal lithotomy position.  She was sterilely prepped and draped.  The bladder was sterilely drained.  Graves speculum was placed.  Tenaculum placed on the anterior lip of the  cervix.  Uterus was sounded to 10 cm, easily dilated to a #27 Pratt dilator.  Hysteroscope was inserted and the above findings were noted.  The Sonata  ultrasound device was inserted and after careful and systematic evaluation of the uterine fibroids,  the largest one was posterior.  We proceeded with fibroid ablation with that in three sections, the first being 3.7 cm for 4 minutes and 36 seconds, the second being 3.2 cm for 3 minutes and 18 seconds, the third ablation at 3.4 cm for 3 minutes and 42  seconds.  This was all done under ultrasound guidance with care taken to avoid underlying bowel with good thermal cautery effect noted by ultrasound guidance.  At this time, we proceeded to evaluate the anterior fibroids for ablation, the fibroids were  just below the bladder and due to the size, we were unable to adequately place the probe from the [facility] without the safety zone encroaching upon the bladder.  We tried this several times to come up with a position, but due to the awkward positioning  below the bladder, unable to proceed with an ablation of the anterior fibroid.  It did appear to be small and intramural.  At this time, Sonata device was removed.  Hysteroscope inserted, revealed normal-appearing cavity, gentle sharp curettage was  performed retrieving fragments of denuded endometrium.  Reexamination revealed normal-appearing endometrial cavity.  The tenaculum removed from the cervix.  Speculum removed.  The patient was then stable and transferred to recovery room.  Sponge and  instrument count was normal x3.  Estimated blood loss was minimal.  Saline deficit was 145 mL. The patient will be discharged home with followup in the office 2-3 weeks with routine instruction sheet for D and C.  Told to return for  increased pain, fever or bleeding.     Elián.Darby D: 01/23/2022 9:20:36 am T: 01/23/2022 10:05:00 am  JOB: [id number]/ [id number]

## 2022-01-24 ENCOUNTER — Encounter (HOSPITAL_BASED_OUTPATIENT_CLINIC_OR_DEPARTMENT_OTHER): Payer: Self-pay | Admitting: Obstetrics and Gynecology

## 2022-01-24 LAB — SURGICAL PATHOLOGY

## 2022-03-14 DIAGNOSIS — E669 Obesity, unspecified: Secondary | ICD-10-CM | POA: Diagnosis not present

## 2022-03-14 DIAGNOSIS — I10 Essential (primary) hypertension: Secondary | ICD-10-CM | POA: Diagnosis not present

## 2022-03-14 DIAGNOSIS — K8020 Calculus of gallbladder without cholecystitis without obstruction: Secondary | ICD-10-CM | POA: Diagnosis not present

## 2022-03-15 DIAGNOSIS — Z23 Encounter for immunization: Secondary | ICD-10-CM | POA: Diagnosis not present

## 2022-03-23 DIAGNOSIS — Z6835 Body mass index (BMI) 35.0-35.9, adult: Secondary | ICD-10-CM | POA: Diagnosis not present

## 2022-03-23 DIAGNOSIS — Z79899 Other long term (current) drug therapy: Secondary | ICD-10-CM | POA: Diagnosis not present

## 2022-03-23 DIAGNOSIS — K8020 Calculus of gallbladder without cholecystitis without obstruction: Secondary | ICD-10-CM | POA: Diagnosis not present

## 2022-03-23 DIAGNOSIS — G4730 Sleep apnea, unspecified: Secondary | ICD-10-CM | POA: Diagnosis not present

## 2022-03-23 DIAGNOSIS — K811 Chronic cholecystitis: Secondary | ICD-10-CM | POA: Diagnosis not present

## 2022-03-23 DIAGNOSIS — K219 Gastro-esophageal reflux disease without esophagitis: Secondary | ICD-10-CM | POA: Diagnosis not present

## 2022-03-23 DIAGNOSIS — E669 Obesity, unspecified: Secondary | ICD-10-CM | POA: Diagnosis not present

## 2022-03-23 DIAGNOSIS — I10 Essential (primary) hypertension: Secondary | ICD-10-CM | POA: Diagnosis not present

## 2022-03-28 DIAGNOSIS — N760 Acute vaginitis: Secondary | ICD-10-CM | POA: Diagnosis not present

## 2022-03-28 DIAGNOSIS — Z113 Encounter for screening for infections with a predominantly sexual mode of transmission: Secondary | ICD-10-CM | POA: Diagnosis not present

## 2022-05-17 DIAGNOSIS — Z6834 Body mass index (BMI) 34.0-34.9, adult: Secondary | ICD-10-CM | POA: Diagnosis not present

## 2022-05-17 DIAGNOSIS — I10 Essential (primary) hypertension: Secondary | ICD-10-CM | POA: Diagnosis not present

## 2022-05-17 DIAGNOSIS — E669 Obesity, unspecified: Secondary | ICD-10-CM | POA: Diagnosis not present

## 2022-05-17 DIAGNOSIS — Z0000 Encounter for general adult medical examination without abnormal findings: Secondary | ICD-10-CM | POA: Diagnosis not present

## 2022-05-17 DIAGNOSIS — D509 Iron deficiency anemia, unspecified: Secondary | ICD-10-CM | POA: Diagnosis not present

## 2022-10-17 ENCOUNTER — Other Ambulatory Visit: Payer: Self-pay | Admitting: Obstetrics and Gynecology

## 2022-10-17 ENCOUNTER — Encounter: Payer: Self-pay | Admitting: Obstetrics and Gynecology

## 2022-10-17 ENCOUNTER — Other Ambulatory Visit: Payer: Self-pay

## 2022-10-17 DIAGNOSIS — R928 Other abnormal and inconclusive findings on diagnostic imaging of breast: Secondary | ICD-10-CM

## 2022-10-20 ENCOUNTER — Ambulatory Visit
Admission: RE | Admit: 2022-10-20 | Discharge: 2022-10-20 | Disposition: A | Discharge status: Home / Self Care | Payer: Federal, State, Local not specified - PPO | Source: Ambulatory Visit | Attending: Obstetrics and Gynecology | Admitting: Obstetrics and Gynecology

## 2022-10-20 ENCOUNTER — Other Ambulatory Visit: Payer: Self-pay | Admitting: Obstetrics and Gynecology

## 2022-10-20 ENCOUNTER — Ambulatory Visit: Payer: Federal, State, Local not specified - PPO

## 2022-10-20 DIAGNOSIS — R928 Other abnormal and inconclusive findings on diagnostic imaging of breast: Secondary | ICD-10-CM

## 2023-04-25 ENCOUNTER — Other Ambulatory Visit: Payer: Self-pay | Admitting: Obstetrics and Gynecology

## 2023-04-25 ENCOUNTER — Ambulatory Visit
Admission: RE | Admit: 2023-04-25 | Discharge: 2023-04-25 | Disposition: A | Discharge status: Home / Self Care | Payer: Federal, State, Local not specified - PPO | Source: Ambulatory Visit | Attending: Obstetrics and Gynecology | Admitting: Obstetrics and Gynecology

## 2023-04-25 DIAGNOSIS — R928 Other abnormal and inconclusive findings on diagnostic imaging of breast: Secondary | ICD-10-CM

## 2023-04-25 DIAGNOSIS — N6489 Other specified disorders of breast: Secondary | ICD-10-CM

## 2023-05-02 ENCOUNTER — Other Ambulatory Visit: Payer: Self-pay | Admitting: Physician Assistant

## 2023-05-02 DIAGNOSIS — R809 Proteinuria, unspecified: Secondary | ICD-10-CM

## 2023-05-02 DIAGNOSIS — E785 Hyperlipidemia, unspecified: Secondary | ICD-10-CM

## 2023-10-25 ENCOUNTER — Ambulatory Visit
Admission: RE | Admit: 2023-10-25 | Discharge: 2023-10-25 | Disposition: A | Discharge status: Home / Self Care | Payer: Federal, State, Local not specified - PPO | Source: Ambulatory Visit | Attending: Obstetrics and Gynecology | Admitting: Obstetrics and Gynecology

## 2023-10-25 DIAGNOSIS — N6489 Other specified disorders of breast: Secondary | ICD-10-CM

## 2024-10-28 ENCOUNTER — Ambulatory Visit (HOSPITAL_COMMUNITY): Admit: 2024-10-28 | Admitting: Obstetrics and Gynecology

## 2024-10-28 SURGERY — HYSTERECTOMY, VAGINAL, LAPAROSCOPY-ASSISTED, WITH SALPINGECTOMY
Anesthesia: Choice | Laterality: Bilateral
